# Patient Record
Sex: Female | Born: 1960 | Race: White | Hispanic: No | Smoking: Current every day smoker
Health system: Southern US, Community
[De-identification: ages and names within clinical notes are randomized; demographics above are authoritative.]

## PROBLEM LIST (undated history)

## (undated) DIAGNOSIS — G43909 Migraine, unspecified, not intractable, without status migrainosus: Secondary | ICD-10-CM

## (undated) DIAGNOSIS — J329 Chronic sinusitis, unspecified: Secondary | ICD-10-CM

## (undated) DIAGNOSIS — J189 Pneumonia, unspecified organism: Secondary | ICD-10-CM

---

## 2014-05-13 DIAGNOSIS — K635 Polyp of colon: Secondary | ICD-10-CM | POA: Insufficient documentation

## 2014-05-13 DIAGNOSIS — J309 Allergic rhinitis, unspecified: Secondary | ICD-10-CM | POA: Insufficient documentation

## 2014-05-13 DIAGNOSIS — J45909 Unspecified asthma, uncomplicated: Secondary | ICD-10-CM | POA: Insufficient documentation

## 2016-06-20 ENCOUNTER — Emergency Department (HOSPITAL_COMMUNITY)
Admission: EM | Admit: 2016-06-20 | Discharge: 2016-06-21 | Disposition: A | Discharge status: Home / Self Care | Payer: Self-pay | Attending: Emergency Medicine | Admitting: Emergency Medicine

## 2016-06-20 ENCOUNTER — Encounter (HOSPITAL_COMMUNITY): Payer: Self-pay | Admitting: Emergency Medicine

## 2016-06-20 DIAGNOSIS — G43809 Other migraine, not intractable, without status migrainosus: Secondary | ICD-10-CM | POA: Insufficient documentation

## 2016-06-20 DIAGNOSIS — Z79899 Other long term (current) drug therapy: Secondary | ICD-10-CM | POA: Insufficient documentation

## 2016-06-20 DIAGNOSIS — F17210 Nicotine dependence, cigarettes, uncomplicated: Secondary | ICD-10-CM | POA: Insufficient documentation

## 2016-06-20 DIAGNOSIS — Z7982 Long term (current) use of aspirin: Secondary | ICD-10-CM | POA: Insufficient documentation

## 2016-06-20 HISTORY — DX: Chronic sinusitis, unspecified: J32.9

## 2016-06-20 HISTORY — DX: Migraine, unspecified, not intractable, without status migrainosus: G43.909

## 2016-06-20 MED ORDER — DEXAMETHASONE SODIUM PHOSPHATE 4 MG/ML IJ SOLN
10.0000 mg | Freq: Once | INTRAMUSCULAR | Status: AC
  Administered 2016-06-20: 10 mg via INTRAVENOUS
  Filled 2016-06-20: qty 3

## 2016-06-20 MED ORDER — SODIUM CHLORIDE 0.9 % IV BOLUS (SEPSIS)
1000.0000 mL | Freq: Once | INTRAVENOUS | Status: AC
  Administered 2016-06-20: 1000 mL via INTRAVENOUS

## 2016-06-20 MED ORDER — PROCHLORPERAZINE EDISYLATE 5 MG/ML IJ SOLN
10.0000 mg | Freq: Once | INTRAMUSCULAR | Status: AC
  Administered 2016-06-20: 10 mg via INTRAVENOUS
  Filled 2016-06-20: qty 2

## 2016-06-20 MED ORDER — DIPHENHYDRAMINE HCL 50 MG/ML IJ SOLN
25.0000 mg | Freq: Once | INTRAMUSCULAR | Status: AC
  Administered 2016-06-20: 25 mg via INTRAVENOUS
  Filled 2016-06-20: qty 1

## 2016-06-20 NOTE — ED Triage Notes (Signed)
Pt reports a "sinus migraine" that began today around 1400. Pt denies emesis. Pt states she has a hx of migraines and allergies, has had problems with her sinuses.

## 2016-06-20 NOTE — ED Provider Notes (Signed)
[facility] DEPT Provider Note   CSN: [id number] Arrival date & time: 06/20/16  1942   By signing my name below, I, Dolores Hoose, attest that this documentation has been prepared under the direction and in the presence of Courtney Paris, MD . Electronically Signed: Dolores Hoose, Scribe. 06/20/2016. 9:49 PM.  History   Chief Complaint Chief Complaint  Patient presents with  . Migraine   The history is provided by the patient. No language interpreter was used.  Migraine  Chronicity: Chronic Exacerbation. The current episode started 6 to 12 hours ago. The problem occurs constantly. The problem has not changed since onset.Associated symptoms include headaches. Pertinent negatives include no chest pain, no abdominal pain and no shortness of breath. Exacerbated by: Sunday Corn. Nothing relieves the symptoms. Treatments tried: Excedrin and Tylenol. The treatment provided no relief.    HPI Comments:  Alexa Johnson is a 55 y.o. female who presents to the Emergency Department complaining of waxing/waning unchanged headache beginning today around 7 hours ago. She describes her pain as a 5/10 dull pain currently, but at its worst she reports it is around an 8/10 dull pain. Pt reports that her pain is located in the front of her head, behind her eyes and is exacerbated by light.  She has had headaches like this before in the past and they are frequently brought on by her allergies. Pt notes that she typically takes excedrin and tylenol for her pain, but has not had any relief with her OTC painkillers today. Pt reports associated nausea and photophobia. She denies any dysuria, constipation, diarrhea, abdominal pain, chest pain, SOB, coughing, fever, chills or visual disturbance. She also denies any recent trauma to her head.  She denies neck stiffness, severe neck pain, or any other complaints.  Past Medical History:  Diagnosis Date  . Migraines   . Sinusitis     There are no active problems  to display for this patient.   Past Surgical History:  Procedure Laterality Date  . CESAREAN SECTION      OB History    Gravida Para Term Preterm AB Living   3 3 3          SAB TAB Ectopic Multiple Live Births                   Home Medications    Prior to Admission medications   Not on File    Family History Family History  Problem Relation Age of Onset  . Diabetes Mother   . Hypertension Mother   . Diabetes Father   . Cancer Father   . Heart failure Other   . Cancer Other     Social History Social History  Substance Use Topics  . Smoking status: Current Every Day Smoker    Packs/day: 0.50    Types: Cigarettes  . Smokeless tobacco: Never Used  . Alcohol use Yes     Comment: rarely     Allergies   Hydrocodone and Sulfa antibiotics   Review of Systems Review of Systems  Constitutional: Negative for chills and fever.  HENT: Negative for congestion and rhinorrhea.   Eyes: Positive for photophobia. Negative for visual disturbance.  Respiratory: Negative for cough, chest tightness, shortness of breath, wheezing and stridor.   Cardiovascular: Negative for chest pain.  Gastrointestinal: Negative for abdominal pain, constipation, diarrhea, nausea and vomiting.  Genitourinary: Negative for dysuria.  Musculoskeletal: Negative for back pain, neck pain and neck stiffness.  Skin: Negative for wound.  Neurological: Positive  for headaches. Negative for dizziness, weakness, light-headedness and numbness.  Psychiatric/Behavioral: Negative for agitation and confusion.  All other systems reviewed and are negative.    Physical Exam Updated Vital Signs BP 124/85 (BP Location: Left Arm)   Pulse 76   Temp 97.6 F (36.4 C) (Oral)   Resp 16   Ht 5\' 6"  (1.676 m)   Wt 170 lb (77.1 kg)   SpO2 97%   BMI 27.44 kg/m   Physical Exam  Constitutional: She is oriented to person, place, and time. She appears well-developed and well-nourished. No distress.  HENT:  Head:  Normocephalic and atraumatic.  Mouth/Throat: Oropharynx is clear and moist. No oropharyngeal exudate.  Eyes: Conjunctivae and EOM are normal. Pupils are equal, round, and reactive to light.  Photosensitive  Neck: Normal range of motion. Neck supple.  No nuchal rigidity or tenderness.   Cardiovascular: Normal rate, regular rhythm, normal heart sounds and intact distal pulses.   No murmur heard. Pulmonary/Chest: Effort normal and breath sounds normal. No stridor. No respiratory distress. She has no wheezes. She exhibits no tenderness.  Abdominal: Soft. There is no tenderness.  Musculoskeletal: She exhibits no edema.  Neurological: She is alert and oriented to person, place, and time. She has normal strength and normal reflexes. She displays no tremor and normal reflexes. No cranial nerve deficit or sensory deficit. She exhibits normal muscle tone. Coordination normal.  Skin: Skin is warm and dry. Capillary refill takes less than 2 seconds. No rash noted.  Psychiatric: She has a normal mood and affect.  Nursing note and vitals reviewed.   ED Treatments / Results  DIAGNOSTIC STUDIES:  Oxygen Saturation is 97% on RA, normal by my interpretation.    COORDINATION OF CARE:  10:09 PM Discussed treatment plan with pt at bedside which included pain medication and pt agreed to plan.  Labs (all labs ordered are listed, but only abnormal results are displayed) Labs Reviewed - No data to display  EKG  EKG Interpretation None       Radiology No results found.  Procedures Procedures (including critical care time)  Medications Ordered in ED Medications  sodium chloride 0.9 % bolus 1,000 mL (0 mLs Intravenous Stopped 06/20/16 2356)  prochlorperazine (COMPAZINE) injection 10 mg (10 mg Intravenous Given 06/20/16 2247)  diphenhydrAMINE (BENADRYL) injection 25 mg (25 mg Intravenous Given 06/20/16 2246)  dexamethasone (DECADRON) injection 10 mg (10 mg Intravenous Given 06/20/16 2249)      Initial Impression / Assessment and Plan / ED Course  I have reviewed the triage vital signs and the nursing notes.  Pertinent labs & imaging results that were available during my care of the patient were reviewed by me and considered in my medical decision making (see chart for details).  Clinical Course    Alexa Johnson is a 54 y.o. female who presents to the Emergency Department complaining of waxing/waning unchanged headache beginning today around 7 hours ago.  History and exam are seen above.  Patients headache feels like her typical severe migraine. Based on exam, don't meningitis. No infectious complaints. Normal neurologic exam. No recent trauma. No red flags on exam.  Patient given headache cocktail with marked improvement in headache severity. Patient feels much better and continue to have unremarkable exam. Given near resolution of headache, patient felt appropriate for discharge and further management at home. Patient given return precautions for any new or worsening symptoms. Patient had no other questions or concerns and patient discharged in good condition.  Final Clinical Impressions(s) /  ED Diagnoses   Final diagnoses:  Other migraine without status migrainosus, not intractable    New Prescriptions Discharge Medication List as of 06/20/2016 11:58 PM      I personally performed the services described in this documentation, which was scribed in my presence. The recorded information has been reviewed and is accurate.   Clinical Impression: 1. Other migraine without status migrainosus, not intractable     Disposition: Discharge  Condition: Good  I have discussed the results, Dx and Tx plan with the pt(& family if present). He/she/they expressed understanding and agree(s) with the plan. Discharge instructions discussed at great length. Strict return precautions discussed and pt &/or family have verbalized understanding of the instructions. No further  questions at time of discharge.    Discharge Medication List as of 06/20/2016 11:58 PM      Follow Up: [facility] [address] [place] [phone] Schedule an appointment as soon as possible for a visit    [facility] [address] [id number] [place]c [place] [phone]  If symptoms worsen      Courtney Paris, MD 06/21/16 1146

## 2016-10-11 ENCOUNTER — Encounter (HOSPITAL_COMMUNITY): Payer: Self-pay | Admitting: Emergency Medicine

## 2016-10-11 ENCOUNTER — Emergency Department (HOSPITAL_COMMUNITY)
Admission: EM | Admit: 2016-10-11 | Discharge: 2016-10-11 | Disposition: A | Discharge status: Home Health | Payer: Self-pay | Attending: Emergency Medicine | Admitting: Emergency Medicine

## 2016-10-11 DIAGNOSIS — J069 Acute upper respiratory infection, unspecified: Secondary | ICD-10-CM | POA: Insufficient documentation

## 2016-10-11 DIAGNOSIS — F17210 Nicotine dependence, cigarettes, uncomplicated: Secondary | ICD-10-CM | POA: Insufficient documentation

## 2016-10-11 MED ORDER — FLUTICASONE PROPIONATE 50 MCG/ACT NA SUSP: 2.0000 | Freq: Every day | NASAL | 1 refills | Status: AC

## 2016-10-11 MED ORDER — PSEUDOEPHEDRINE HCL 60 MG PO TABS: 60.0000 mg | ORAL_TABLET | Freq: Four times a day (QID) | ORAL | 0 refills | Status: DC | PRN

## 2016-10-11 NOTE — ED Triage Notes (Signed)
Cold symptoms since Sunday.  Cough non-productive, sore throat, nasal congestion, and headache.

## 2016-10-11 NOTE — Discharge Instructions (Signed)
Tylenol or ibuprofen if needed for headache.  Follow-up with your doctor or return here for any worsening symtpoms

## 2016-10-13 NOTE — ED Provider Notes (Signed)
[facility] DEPT Provider Note   CSN: [id number] Arrival date & time: 10/11/16  1250     History   Chief Complaint Chief Complaint  Patient presents with  . Nasal Congestion    HPI Alexa Johnson is a 56 y.o. female.  HPI   Alexa Johnson is a 56 y.o. female who presents to the Emergency Department complaining of sore throat, nasal congestion, frontal headache and occasional cough.  Symptoms present for three days.  Cough is non-productive.  She states her symptoms feel similar to previous sinus infections.  She has tried OTC sinus medications w/o relief.  She denies shortness of breath, chest pain, neck pain or stiffness, fever, chills, and body aches.   Past Medical History:  Diagnosis Date  . Migraines   . Sinusitis     There are no active problems to display for this patient.   Past Surgical History:  Procedure Laterality Date  . CESAREAN SECTION      OB History    Gravida Para Term Preterm AB Living   3 3 3          SAB TAB Ectopic Multiple Live Births                   Home Medications    Prior to Admission medications   Medication Sig Start Date End Date Taking? Authorizing Provider  aspirin-acetaminophen-caffeine (EXCEDRIN EXTRA STRENGTH) [phone] MG tablet Take 1-2 tablets by mouth every 6 (six) hours as needed for headache.    Historical Provider, MD  fluticasone (FLONASE) 50 MCG/ACT nasal spray Place 2 sprays into both nostrils daily. 10/11/16   Lorimer Tiberio, PA-C  Misc Natural Products (SINUS FORMULA PO) Take 2 tablets by mouth daily as needed (for sinus relief).    Historical Provider, MD  pseudoephedrine (SUDAFED) 60 MG tablet Take 1 tablet (60 mg total) by mouth every 6 (six) hours as needed for congestion. 10/11/16   Estelene Carmack, PA-C    Family History Family History  Problem Relation Age of Onset  . Diabetes Mother   . Hypertension Mother   . Diabetes Father   . Cancer Father   . Heart failure Other   . Cancer Other      Social History Social History  Substance Use Topics  . Smoking status: Current Every Day Smoker    Packs/day: 0.50    Types: Cigarettes  . Smokeless tobacco: Never Used  . Alcohol use Yes     Comment: rarely     Allergies   Hydrocodone and Sulfa antibiotics   Review of Systems Review of Systems  Constitutional: Negative for activity change, appetite change, chills and fever.  HENT: Positive for congestion, sinus pain, sinus pressure and sore throat. Negative for facial swelling, rhinorrhea and trouble swallowing.   Eyes: Negative for visual disturbance.  Respiratory: Positive for cough. Negative for shortness of breath, wheezing and stridor.   Cardiovascular: Negative for chest pain.  Gastrointestinal: Negative for nausea and vomiting.  Musculoskeletal: Negative for neck pain and neck stiffness.  Skin: Negative.  Negative for rash.  Neurological: Positive for headaches. Negative for dizziness, syncope, speech difficulty, weakness and numbness.  Hematological: Negative for adenopathy.  All other systems reviewed and are negative.    Physical Exam Updated Vital Signs BP 130/85 (BP Location: Left Arm)   Pulse 78   Temp 98.2 F (36.8 C) (Oral)   Resp 16   Ht 5\' 6"  (1.676 m)   Wt 77.1 kg   SpO2 98%  BMI 27.44 kg/m   Physical Exam  Constitutional: She is oriented to person, place, and time. She appears well-developed and well-nourished. No distress.  HENT:  Head: Normocephalic and atraumatic.  Right Ear: Tympanic membrane and ear canal normal.  Left Ear: Tympanic membrane and ear canal normal.  Nose: Mucosal edema present. No rhinorrhea. Right sinus exhibits frontal sinus tenderness. Left sinus exhibits frontal sinus tenderness.  Mouth/Throat: Uvula is midline and mucous membranes are normal. No trismus in the jaw. No uvula swelling. Posterior oropharyngeal erythema present. No oropharyngeal exudate, posterior oropharyngeal edema or tonsillar abscesses. Tonsils  are 0 on the right. Tonsils are 0 on the left.  Eyes: Conjunctivae are normal.  Neck: Normal range of motion and phonation normal. Neck supple. No Brudzinski's sign and no Kernig's sign noted.  Cardiovascular: Normal rate, regular rhythm, normal heart sounds and intact distal pulses.   No murmur heard. Pulmonary/Chest: Effort normal and breath sounds normal. No respiratory distress. She has no wheezes. She has no rales.  Musculoskeletal: She exhibits no edema.  Lymphadenopathy:    She has no cervical adenopathy.  Neurological: She is alert and oriented to person, place, and time. She exhibits normal muscle tone. Coordination normal.  Skin: Skin is warm and dry.  Nursing note and vitals reviewed.    ED Treatments / Results  Labs (all labs ordered are listed, but only abnormal results are displayed) Labs Reviewed - No data to display  EKG  EKG Interpretation None       Radiology No results found.  Procedures Procedures (including critical care time)  Medications Ordered in ED Medications - No data to display   Initial Impression / Assessment and Plan / ED Course  I have reviewed the triage vital signs and the nursing notes.  Pertinent labs & imaging results that were available during my care of the patient were reviewed by me and considered in my medical decision making (see chart for details).     Pt well appearing,vitals stable.  Sx's likely related to viral URI.  Pt agrees to symptomatic tx and PCP f/u if needed.  Pt appears stable for d/c and agrees to plan  Final Clinical Impressions(s) / ED Diagnoses   Final diagnoses:  Acute upper respiratory infection    New Prescriptions Discharge Medication List as of 10/11/2016  1:42 PM    START taking these medications   Details  fluticasone (FLONASE) 50 MCG/ACT nasal spray Place 2 sprays into both nostrils daily., Starting Wed 10/11/2016, Print    pseudoephedrine (SUDAFED) 60 MG tablet Take 1 tablet (60 mg total) by  mouth every 6 (six) hours as needed for congestion., Starting Wed 10/11/2016, Print         Richy Spradley [place], PA-C 10/13/16 2156    Merrily Pew, MD 10/15/16 1209

## 2016-10-16 ENCOUNTER — Emergency Department (HOSPITAL_COMMUNITY)
Admission: EM | Admit: 2016-10-16 | Discharge: 2016-10-16 | Disposition: A | Discharge status: Home / Self Care | Payer: Self-pay | Attending: Emergency Medicine | Admitting: Emergency Medicine

## 2016-10-16 ENCOUNTER — Encounter (HOSPITAL_COMMUNITY): Payer: Self-pay | Admitting: Emergency Medicine

## 2016-10-16 DIAGNOSIS — Z7982 Long term (current) use of aspirin: Secondary | ICD-10-CM | POA: Insufficient documentation

## 2016-10-16 DIAGNOSIS — F17210 Nicotine dependence, cigarettes, uncomplicated: Secondary | ICD-10-CM | POA: Insufficient documentation

## 2016-10-16 DIAGNOSIS — J029 Acute pharyngitis, unspecified: Secondary | ICD-10-CM | POA: Insufficient documentation

## 2016-10-16 DIAGNOSIS — Z79899 Other long term (current) drug therapy: Secondary | ICD-10-CM | POA: Insufficient documentation

## 2016-10-16 LAB — RAPID STREP SCREEN (MED CTR MEBANE ONLY): STREPTOCOCCUS, GROUP A SCREEN (DIRECT): NEGATIVE

## 2016-10-16 MED ORDER — KETOROLAC TROMETHAMINE 30 MG/ML IJ SOLN
30.0000 mg | Freq: Once | INTRAMUSCULAR | Status: AC
  Administered 2016-10-16: 30 mg via INTRAMUSCULAR
  Filled 2016-10-16: qty 1

## 2016-10-16 MED ORDER — IBUPROFEN 600 MG PO TABS: 600.0000 mg | ORAL_TABLET | Freq: Four times a day (QID) | ORAL | 0 refills | Status: DC | PRN

## 2016-10-16 MED ORDER — DEXAMETHASONE SODIUM PHOSPHATE 10 MG/ML IJ SOLN
10.0000 mg | Freq: Once | INTRAMUSCULAR | Status: AC
  Administered 2016-10-16: 10 mg via INTRAMUSCULAR
  Filled 2016-10-16: qty 1

## 2016-10-16 NOTE — ED Triage Notes (Signed)
Pt c/o sore throat since last Sunday with worsening bilateral ear pain. Pt seen in ED on Wednesday for cough/congestion and ha.

## 2016-10-16 NOTE — ED Provider Notes (Signed)
[facility] DEPT Provider Note   CSN: [id number] Arrival date & time: 10/16/16  [id number]  By signing my name below, I, Ethelle Lyon Long, attest that this documentation has been prepared under the direction and in the presence of Isla Pence, MD . Electronically Signed: Ethelle Lyon Long, Scribe. 10/16/2016. 8:39 AM.   History   Chief Complaint Chief Complaint  Patient presents with  . Sore Throat    The history is provided by the patient and medical records. No language interpreter was used.   HPI Comments:  Alexa Johnson is a 56 y.o. female who presents to the Emergency Department complaining of a gradually worsening sore throat onset ~2-3 days ago. She notes the throat pain worsening two to three days ago with slight alleviation of her symptoms by consumption of hot liquids. She has decreased PO intake d/t pain. She has associated symptoms of a subjective fever, bilateral ear pain, cough, congestion, and HA. Per chart review, pt was seen in the ED 10/11/2016 for similar symptoms of cough, congestion, and Ha. Pt is taking Excedrin and Flonase with mild relief of her symptoms. Pt is a current every day smoker.   Past Medical History:  Diagnosis Date  . Migraines   . Sinusitis     There are no active problems to display for this patient.   Past Surgical History:  Procedure Laterality Date  . CESAREAN SECTION      OB History    Gravida Para Term Preterm AB Living   3 3 3          SAB TAB Ectopic Multiple Live Births                   Home Medications    Prior to Admission medications   Medication Sig Start Date End Date Taking? Authorizing Provider  aspirin-acetaminophen-caffeine (EXCEDRIN EXTRA STRENGTH) [phone] MG tablet Take 1-2 tablets by mouth every 6 (six) hours as needed for headache.    Historical Provider, MD  fluticasone (FLONASE) 50 MCG/ACT nasal spray Place 2 sprays into both nostrils daily. 10/11/16   Tammy Triplett, PA-C  ibuprofen (ADVIL,MOTRIN) 600 MG  tablet Take 1 tablet (600 mg total) by mouth every 6 (six) hours as needed. 10/16/16   Isla Pence, MD  Misc Natural Products (SINUS FORMULA PO) Take 2 tablets by mouth daily as needed (for sinus relief).    Historical Provider, MD  pseudoephedrine (SUDAFED) 60 MG tablet Take 1 tablet (60 mg total) by mouth every 6 (six) hours as needed for congestion. 10/11/16   Tammy Triplett, PA-C    Family History Family History  Problem Relation Age of Onset  . Diabetes Mother   . Hypertension Mother   . Diabetes Father   . Cancer Father   . Heart failure Other   . Cancer Other     Social History Social History  Substance Use Topics  . Smoking status: Current Every Day Smoker    Packs/day: 0.50    Types: Cigarettes  . Smokeless tobacco: Never Used  . Alcohol use Yes     Comment: rarely     Allergies   Hydrocodone and Sulfa antibiotics   Review of Systems Review of Systems 10 systems reviewed and all are negative for acute change except as noted in the HPI.    Physical Exam Updated Vital Signs BP 124/80   Pulse 62   Temp 98.1 F (36.7 C) (Oral)   Resp 16   Ht 5\' 6"  (1.676 m)   Wt  170 lb (77.1 kg)   SpO2 98%   BMI 27.44 kg/m   Physical Exam  Constitutional: She is oriented to person, place, and time. She appears well-developed and well-nourished.  HENT:  Head: Normocephalic and atraumatic.  Right Ear: Tympanic membrane, external ear and ear canal normal.  Left Ear: Tympanic membrane, external ear and ear canal normal.  Nose: Nose normal.  Pharyngeal erythema.   Eyes: Right eye exhibits no discharge. Left eye exhibits no discharge.  Cardiovascular: Normal rate, regular rhythm and normal heart sounds.   Pulmonary/Chest: Effort normal and breath sounds normal.  Abdominal: Soft. There is no tenderness.  Lymphadenopathy:    She has cervical adenopathy.  Neurological: She is alert and oriented to person, place, and time.  Skin: Skin is warm and dry.  Nursing note and  vitals reviewed.    ED Treatments / Results  DIAGNOSTIC STUDIES:  Oxygen Saturation is 96% on RA, adequate by my interpretation.    COORDINATION OF CARE:  8:35 AM Discussed treatment plan with pt at bedside including symptom relief and pt agreed to plan.  .Labs (all labs ordered are listed, but only abnormal results are displayed) Labs Reviewed  RAPID STREP SCREEN (NOT AT [facility])  CULTURE, GROUP A STREP [facility])    EKG  EKG Interpretation None       Radiology No results found.  Procedures Procedures (including critical care time)  Medications Ordered in ED Medications  ketorolac (TORADOL) 30 MG/ML injection 30 mg (30 mg Intramuscular Given 10/16/16 0843)  dexamethasone (DECADRON) injection 10 mg (10 mg Intramuscular Given 10/16/16 0842)     Initial Impression / Assessment and Plan / ED Course  I have reviewed the triage vital signs and the nursing notes.  Pertinent labs & imaging results that were available during my care of the patient were reviewed by me and considered in my medical decision making (see chart for details).    Strep is negative.  Pt to be treated with ibuprofen.  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  She knows to return if worse.   Final Clinical Impressions(s) / ED Diagnoses   Final diagnoses:  Viral pharyngitis    New Prescriptions Discharge Medication List as of 10/16/2016  9:25 AM    START taking these medications   Details  ibuprofen (ADVIL,MOTRIN) 600 MG tablet Take 1 tablet (600 mg total) by mouth every 6 (six) hours as needed., Starting Mon 10/16/2016, Print       I personally performed the services described in this documentation, which was scribed in my presence. The recorded information has been reviewed and is accurate.     Isla Pence, MD 10/16/16 [phone]

## 2016-10-18 LAB — CULTURE, GROUP A STREP (THRC)

## 2016-12-11 ENCOUNTER — Emergency Department (HOSPITAL_COMMUNITY)
Admission: EM | Admit: 2016-12-11 | Discharge: 2016-12-11 | Disposition: A | Discharge status: Home / Self Care | Payer: Self-pay | Attending: Emergency Medicine | Admitting: Emergency Medicine

## 2016-12-11 ENCOUNTER — Encounter (HOSPITAL_COMMUNITY): Payer: Self-pay | Admitting: Emergency Medicine

## 2016-12-11 ENCOUNTER — Emergency Department (HOSPITAL_COMMUNITY): Payer: Self-pay

## 2016-12-11 DIAGNOSIS — X501XXA Overexertion from prolonged static or awkward postures, initial encounter: Secondary | ICD-10-CM | POA: Insufficient documentation

## 2016-12-11 DIAGNOSIS — M25552 Pain in left hip: Secondary | ICD-10-CM

## 2016-12-11 DIAGNOSIS — Y92000 Kitchen of unspecified non-institutional (private) residence as  the place of occurrence of the external cause: Secondary | ICD-10-CM | POA: Insufficient documentation

## 2016-12-11 DIAGNOSIS — Y9301 Activity, walking, marching and hiking: Secondary | ICD-10-CM | POA: Insufficient documentation

## 2016-12-11 DIAGNOSIS — Z79899 Other long term (current) drug therapy: Secondary | ICD-10-CM | POA: Insufficient documentation

## 2016-12-11 DIAGNOSIS — S76012A Strain of muscle, fascia and tendon of left hip, initial encounter: Secondary | ICD-10-CM

## 2016-12-11 DIAGNOSIS — F17210 Nicotine dependence, cigarettes, uncomplicated: Secondary | ICD-10-CM | POA: Insufficient documentation

## 2016-12-11 DIAGNOSIS — Y999 Unspecified external cause status: Secondary | ICD-10-CM | POA: Insufficient documentation

## 2016-12-11 HISTORY — DX: Pneumonia, unspecified organism: J18.9

## 2016-12-11 MED ORDER — DEXAMETHASONE 4 MG PO TABS: 4.0000 mg | ORAL_TABLET | Freq: Two times a day (BID) | ORAL | 0 refills | Status: DC

## 2016-12-11 MED ORDER — PREDNISONE 20 MG PO TABS
40.0000 mg | ORAL_TABLET | Freq: Once | ORAL | Status: AC
  Administered 2016-12-11: 40 mg via ORAL
  Filled 2016-12-11: qty 2

## 2016-12-11 MED ORDER — DICLOFENAC SODIUM 75 MG PO TBEC: 75.0000 mg | DELAYED_RELEASE_TABLET | Freq: Two times a day (BID) | ORAL | 0 refills | Status: DC

## 2016-12-11 MED ORDER — ONDANSETRON HCL 4 MG PO TABS
4.0000 mg | ORAL_TABLET | Freq: Once | ORAL | Status: AC
  Administered 2016-12-11: 4 mg via ORAL
  Filled 2016-12-11: qty 1

## 2016-12-11 MED ORDER — CYCLOBENZAPRINE HCL 10 MG PO TABS
10.0000 mg | ORAL_TABLET | Freq: Once | ORAL | Status: AC
  Administered 2016-12-11: 10 mg via ORAL
  Filled 2016-12-11: qty 1

## 2016-12-11 MED ORDER — CYCLOBENZAPRINE HCL 10 MG PO TABS: 10.0000 mg | ORAL_TABLET | Freq: Three times a day (TID) | ORAL | 0 refills | Status: DC

## 2016-12-11 MED ORDER — KETOROLAC TROMETHAMINE 10 MG PO TABS
10.0000 mg | ORAL_TABLET | Freq: Once | ORAL | Status: AC
  Administered 2016-12-11: 10 mg via ORAL
  Filled 2016-12-11: qty 1

## 2016-12-11 NOTE — ED Provider Notes (Signed)
[facility] DEPT Provider Note   CSN: [id number] Arrival date & time: 12/11/16  7591  By signing my name below, I, Jeanell Sparrow, attest that this documentation has been prepared under the direction and in the presence of non-physician practitioner, Lily Kocher, PA-C. Electronically Signed: Jeanell Sparrow, Scribe. 12/11/2016. 11:54 AM.  History   Chief Complaint Chief Complaint  Patient presents with  . Hip Pain   The history is provided by the patient. No language interpreter was used.  Hip Pain  This is a new problem. The current episode started more than 2 days ago. The problem occurs constantly. The problem has not changed since onset.Nothing aggravates the symptoms. Nothing relieves the symptoms. She has tried nothing for the symptoms.   HPI Comments: Alexa Johnson is a 56 y.o. female with a PMHx of Sciatica who presents to the Emergency Department complaining of constant moderate left hip pain that started about 4 days ago. She states she started ambulating in her kitchen when she suddenly "heard a pop". She describes the pain both as different from her Sciatica pain and unaffected by movement or position. She denies any prior hx of similar complaint, hx of hip surgery, or other complaints.     [facility]   Past Medical History:  Diagnosis Date  . Migraines   . Pneumonia   . Sinusitis     There are no active problems to display for this patient.   Past Surgical History:  Procedure Laterality Date  . CESAREAN SECTION      OB History    Gravida Para Term Preterm AB Living   3 3 3          SAB TAB Ectopic Multiple Live Births                   Home Medications    Prior to Admission medications   Medication Sig Start Date End Date Taking? Authorizing Provider  aspirin-acetaminophen-caffeine (EXCEDRIN EXTRA STRENGTH) [phone] MG tablet Take 1-2 tablets by mouth every 6 (six) hours as needed for headache.   Yes Historical Provider, MD  fluticasone  (FLONASE) 50 MCG/ACT nasal spray Place 2 sprays into both nostrils daily. 10/11/16  Yes Tammy Triplett, PA-C  Misc Natural Products (SINUS FORMULA PO) Take 2 tablets by mouth daily as needed (for sinus relief).   Yes Historical Provider, MD  pseudoephedrine (SUDAFED) 60 MG tablet Take 1 tablet (60 mg total) by mouth every 6 (six) hours as needed for congestion. 10/11/16  Yes Tammy Triplett, PA-C  cyclobenzaprine (FLEXERIL) 10 MG tablet Take 1 tablet (10 mg total) by mouth 3 (three) times daily. 12/11/16   Lily Kocher, PA-C  dexamethasone (DECADRON) 4 MG tablet Take 1 tablet (4 mg total) by mouth 2 (two) times daily with a meal. 12/11/16   Lily Kocher, PA-C  diclofenac (VOLTAREN) 75 MG EC tablet Take 1 tablet (75 mg total) by mouth 2 (two) times daily. 12/11/16   Lily Kocher, PA-C  ibuprofen (ADVIL,MOTRIN) 600 MG tablet Take 1 tablet (600 mg total) by mouth every 6 (six) hours as needed. Patient not taking: Reported on 12/11/2016 10/16/16   Isla Pence, MD    Family History Family History  Problem Relation Age of Onset  . Diabetes Mother   . Hypertension Mother   . Diabetes Father   . Cancer Father   . Heart failure Other   . Cancer Other     Social History Social History  Substance Use Topics  . Smoking status: Current  Every Day Smoker    Packs/day: 0.50    Types: Cigarettes  . Smokeless tobacco: Never Used  . Alcohol use Yes     Comment: rarely     Allergies   Hydrocodone and Sulfa antibiotics   Review of Systems Review of Systems  Musculoskeletal: Positive for myalgias (Left hip).  All other systems reviewed and are negative.    Physical Exam Updated Vital Signs BP 125/75 (BP Location: Right Arm)   Pulse 72   Temp 98.9 F (37.2 C) (Oral)   Resp 18   Ht 5\' 6"  (1.676 m)   Wt 170 lb (77.1 kg)   SpO2 97%   BMI 27.44 kg/m   Physical Exam  Constitutional: She appears well-developed and well-nourished. No distress.  HENT:  Head: Normocephalic.  Eyes: Conjunctivae  are normal.  Neck: Neck supple.  Cardiovascular: Normal rate and regular rhythm.   Pulmonary/Chest: Effort normal.  Abdominal: Soft. She exhibits no mass.  Soft. No pulsating mass.   Musculoskeletal: Normal range of motion.  DP pulses are +2 bilaterally. No temperature changes of the BLE. Degenerative changes of the left knee. No effusion or mass. No deformity of the quadricept area. Tenderness to movement and palpation of the left hip. No shortening of the LE.   Neurological: She is alert.  Gait is intact. No foot drop. Does walk with a limp.   Skin: Skin is warm and dry.  Psychiatric: She has a normal mood and affect.  Nursing note and vitals reviewed.    ED Treatments / Results  DIAGNOSTIC STUDIES: Oxygen Saturation is 97% on RA, normal by my interpretation.    COORDINATION OF CARE: 11:58 AM- Pt advised of plan for treatment and pt agrees.  Labs (all labs ordered are listed, but only abnormal results are displayed) Labs Reviewed - No data to display  EKG  EKG Interpretation None       Radiology Dg Hip Unilat W Or Wo Pelvis 2-3 Views Left  Result Date: 12/11/2016 CLINICAL DATA:  Pain for 4 days EXAM: DG HIP (WITH OR WITHOUT PELVIS) 2-3V LEFT COMPARISON:  None. FINDINGS: Frontal pelvis as well as frontal and lateral left hip images were obtained. There is no fracture or dislocation. Joint spaces appear normal. No erosive change. IMPRESSION: No fracture or dislocation.  No appreciable arthropathy. Electronically Signed   By: Lowella Grip III M.D.   On: 12/11/2016 09:59    Procedures Procedures (including critical care time)  Medications Ordered in ED Medications  cyclobenzaprine (FLEXERIL) tablet 10 mg (10 mg Oral Given 12/11/16 1221)  predniSONE (DELTASONE) tablet 40 mg (40 mg Oral Given 12/11/16 1221)  ketorolac (TORADOL) tablet 10 mg (10 mg Oral Given 12/11/16 1221)  ondansetron (ZOFRAN) tablet 4 mg (4 mg Oral Given 12/11/16 1221)     Initial Impression / Assessment  and Plan / ED Course  I have reviewed the triage vital signs and the nursing notes.  Pertinent labs & imaging results that were available during my care of the patient were reviewed by me and considered in my medical decision making (see chart for details).     **I have reviewed nursing notes, vital signs, and all appropriate lab and imaging results for this patient.*  Final Clinical Impressions(s) / ED Diagnoses   MDM: Left hip area pain is not improved with rest and warm tub soaks. No reported loss of bowel or bladder function. Xray of the hip and pelvis are negative for acute changes. I suspect musculoskeletal strain and  osteoarthritis. Pt will be treated with warm tub soak, muscles relaxant, steroid, and anti-inflammatory pain medication. Will see orthopedics if not improving.   Final diagnoses:  Left hip pain  Hip strain, left, initial encounter    New Prescriptions Discharge Medication List as of 12/11/2016 12:16 PM    START taking these medications   Details  cyclobenzaprine (FLEXERIL) 10 MG tablet Take 1 tablet (10 mg total) by mouth 3 (three) times daily., Starting Mon 12/11/2016, Print    dexamethasone (DECADRON) 4 MG tablet Take 1 tablet (4 mg total) by mouth 2 (two) times daily with a meal., Starting Mon 12/11/2016, Print    diclofenac (VOLTAREN) 75 MG EC tablet Take 1 tablet (75 mg total) by mouth 2 (two) times daily., Starting Mon 12/11/2016, Print       **I personally performed the services described in this documentation, which was scribed in my presence. The recorded information has been reviewed and is accurate.Lily Kocher, PA-C 12/12/16 1901    Nat Christen, MD 12/14/16 1003

## 2016-12-11 NOTE — Discharge Instructions (Signed)
The x-ray of your left hip and the x-ray of your pelvis is negative for fracture or dislocation. I suspect that you have a muscle strain, aggravated by some arthritis or degenerative changes. Please use warm tub soaks 2 times daily. Use Flexeril 3 times daily for spasm pain. Use Decadron and diclofenac 2 times daily with food. Flexeril may cause drowsiness. Please do not drive a vehicle, operating machinery, drink alcohol, or participate in activities requiring concentration when taking this medication. Please see Dr. Aline Brochure for orthopedic evaluation if not improving.

## 2016-12-11 NOTE — ED Triage Notes (Signed)
Pt c/o sudden left hip pain x 4 days ago. Denies injury. States must have turned the wrong way. Mild limping noted.

## 2016-12-11 NOTE — ED Notes (Signed)
Pt made aware to return if symptoms worsen or if any life threatening symptoms occur.   

## 2017-10-13 ENCOUNTER — Encounter (HOSPITAL_COMMUNITY): Payer: Self-pay

## 2017-10-13 ENCOUNTER — Emergency Department (HOSPITAL_COMMUNITY)
Admission: EM | Admit: 2017-10-13 | Discharge: 2017-10-13 | Disposition: A | Discharge status: Home / Self Care | Payer: Self-pay | Attending: Emergency Medicine | Admitting: Emergency Medicine

## 2017-10-13 DIAGNOSIS — Z79899 Other long term (current) drug therapy: Secondary | ICD-10-CM | POA: Insufficient documentation

## 2017-10-13 DIAGNOSIS — F17210 Nicotine dependence, cigarettes, uncomplicated: Secondary | ICD-10-CM | POA: Insufficient documentation

## 2017-10-13 DIAGNOSIS — J040 Acute laryngitis: Secondary | ICD-10-CM | POA: Insufficient documentation

## 2017-10-13 MED ORDER — ACETAMINOPHEN 325 MG PO TABS: 650.0000 mg | ORAL_TABLET | Freq: Four times a day (QID) | ORAL | 0 refills | Status: DC | PRN

## 2017-10-13 MED ORDER — ACETAMINOPHEN 325 MG PO TABS
650.0000 mg | ORAL_TABLET | Freq: Once | ORAL | Status: AC
  Administered 2017-10-13: 650 mg via ORAL
  Filled 2017-10-13: qty 2

## 2017-10-13 NOTE — ED Provider Notes (Signed)
[facility] EMERGENCY DEPARTMENT Provider Note   CSN: [id number] Arrival date & time: 10/13/17  0830     History   Chief Complaint Chief Complaint  Patient presents with  . Sore Throat    HPI Alexa Johnson is a 57 y.o. female.  HPI   Patient is a 57 year old female who is presenting to the ED complaining of a sore throat and that she lost her voice starting yesterday.  Patient states that she woke up around 6:30 PM yesterday when she experienced these sxs.  Also notes some swollen lymph nodes to the anterior part of her neck as well as some bilateral ear pain and a frontal HA. Has frequent HA's from sinus congestion, and this feels similar to that. No sudden onset, not severe pain. She has chronic nasal congestion and postnasal drip from allergies but denies any sinus pain pressure, fevers, cough, shortness of breath, chest pain, nausea, vomiting, abdominal pain, diarrhea.  Has been able tolerate PO and is able to tolerate her own secretions. Denies any lip/tongue swelling, throat swelling, difficulty swallowing, or neck stiffness. She denies any recent URI symptoms prior to the onset of the symptoms.  Has chronic posterior/right sided neck pain and trapezious pain. 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 No one in the household has similar symptoms. Pt smokes tobacco. Has a frequent h/o laryngitis.  Past Medical History:  Diagnosis Date  . Migraines   . Pneumonia   . Sinusitis     There are no active problems to display for this patient.   Past Surgical History:  Procedure Laterality Date  . CESAREAN SECTION      OB History    Gravida Para Term Preterm AB Living   3 3 3          SAB TAB Ectopic Multiple Live Births                   Home Medications    Prior to Admission medications   Medication Sig Start Date End Date Taking? Authorizing Provider  acetaminophen (TYLENOL) 325 MG tablet Take 2 tablets (650 mg total) by mouth every 6 (six)  hours as needed. 10/13/17   Myron Stankovich S, PA-C  aspirin-acetaminophen-caffeine (EXCEDRIN EXTRA STRENGTH) [phone] MG tablet Take 1-2 tablets by mouth every 6 (six) hours as needed for headache.    [provider]  cyclobenzaprine (FLEXERIL) 10 MG tablet Take 1 tablet (10 mg total) by mouth 3 (three) times daily. 12/11/16   Lily Kocher, PA-C  dexamethasone (DECADRON) 4 MG tablet Take 1 tablet (4 mg total) by mouth 2 (two) times daily with a meal. 12/11/16   Lily Kocher, PA-C  diclofenac (VOLTAREN) 75 MG EC tablet Take 1 tablet (75 mg total) by mouth 2 (two) times daily. 12/11/16   Lily Kocher, PA-C  fluticasone (FLONASE) 50 MCG/ACT nasal spray Place 2 sprays into both nostrils daily. 10/11/16   Triplett, Tammy, PA-C  ibuprofen (ADVIL,MOTRIN) 600 MG tablet Take 1 tablet (600 mg total) by mouth every 6 (six) hours as needed. Patient not taking: Reported on 12/11/2016 10/16/16   Isla Pence, MD  Misc Natural Products (SINUS FORMULA PO) Take 2 tablets by mouth daily as needed (for sinus relief).    [provider]  pseudoephedrine (SUDAFED) 60 MG tablet Take 1 tablet (60 mg total) by mouth every 6 (six) hours as needed for congestion. 10/11/16   Kem Parkinson, PA-C    Family History Family History  Problem Relation Age of Onset  . Diabetes Mother   . Hypertension Mother   . Diabetes Father   . Cancer Father   . Heart failure Other   . Cancer Other     Social History Social History   Tobacco Use  . Smoking status: Current Every Day Smoker    Packs/day: 0.50    Types: Cigarettes  . Smokeless tobacco: Never Used  Substance Use Topics  . Alcohol use: Yes    Comment: rarely  . Drug use: No     Allergies   Hydrocodone and Sulfa antibiotics   Review of Systems Review of Systems  Constitutional: Negative for chills and fever.  HENT: Positive for congestion, postnasal drip, sore throat and voice change. Negative for drooling, sinus pressure, sinus pain and  trouble swallowing.   Respiratory: Negative for cough, shortness of breath and wheezing.   Cardiovascular: Negative for chest pain.  Gastrointestinal: Negative for abdominal pain, constipation, diarrhea and vomiting.  Genitourinary: Negative for dysuria and flank pain.  Musculoskeletal: Positive for neck pain (chronic). Negative for neck stiffness.  Skin: Negative for rash.  Neurological: Positive for headaches. Negative for dizziness, light-headedness and numbness.      Physical Exam Updated Vital Signs BP 123/81 (BP Location: Right Arm)   Pulse 78   Temp 97.9 F (36.6 C) (Oral)   Resp 18   Ht 5\' 6"  (1.676 m)   Wt 81.6 kg (180 lb)   SpO2 98%   BMI 29.05 kg/m   Physical Exam  Constitutional: She appears well-developed and well-nourished. No distress.  Nontoxic appearing. No acute distress  HENT:  Head: Normocephalic and atraumatic.  Right Ear: Hearing, tympanic membrane and ear canal normal. No middle ear effusion.  Left Ear: Hearing, tympanic membrane and ear canal normal.  No middle ear effusion.  Mouth/Throat: Uvula is midline and mucous membranes are normal. No oral lesions. No uvula swelling. Posterior oropharyngeal erythema present. No oropharyngeal exudate, posterior oropharyngeal edema or tonsillar abscesses. Tonsils are 0 on the right. Tonsils are 0 on the left. No tonsillar exudate.  No posterior auricular lymphadenopathy.  No mastoid tenderness. Pt is whispering on exam. No hot potato voice.  Eyes: Conjunctivae and EOM are normal. Pupils are equal, round, and reactive to light.  Neck: Normal range of motion. Neck supple.  Some cervical adenopathy anteriorly. No nuchal rigidity. Able to fully flex chin to chest. Mild paraspinous and trapezious TTP to the right with muscle spasm.  Cardiovascular: Normal rate, regular rhythm and normal heart sounds.  No murmur heard. Pulmonary/Chest: Effort normal and breath sounds normal. No stridor. No respiratory distress. She has no  wheezes.  Abdominal: Soft. Bowel sounds are normal. There is no tenderness.  Musculoskeletal: She exhibits no edema.  Neurological: She is alert.  Cranial Nerves:  II:  pupils equal, round, reactive to light III,IV, VI: ptosis not present, extra-ocular motions intact bilaterally  V,VII: smile symmetric, facial light touch sensation equal VIII: hearing grossly normal to voice  X: uvula elevates symmetrically  XI: bilateral shoulder shrug symmetric and strong XII: midline tongue extension without fassiculations  Skin: Skin is warm and dry.  Psychiatric: She has a normal mood and affect.  Nursing note and vitals reviewed.    ED Treatments / Results  Labs (all labs ordered are listed, but only abnormal results are displayed) Labs Reviewed - No data to display  EKG  EKG Interpretation None       Radiology No results found.  Procedures Procedures (including  critical care time)  Medications Ordered in ED Medications  acetaminophen (TYLENOL) tablet 650 mg (650 mg Oral Given 10/13/17 0914)     Initial Impression / Assessment and Plan / ED Course  I have reviewed the triage vital signs and the nursing notes.  Pertinent labs & imaging results that were available during my care of the patient were reviewed by me and considered in my medical decision making (see chart for details).   Evaluated patient in the ED.  Vital signs are stable and.  Patient is nontoxic-appearing.  She is requesting some water.  Is able to tolerate p.o.  Will order Tylenol.  Discussed pt presentation and exam findings with Dr. Tomi Bamberger, who reviewed the patient's throat and agrees that patient likely has acute laryngitis and that there is no evidence of a peritonsillar abscess.   Final Clinical Impressions(s) / ED Diagnoses   Final diagnoses:  Laryngitis, acute    57 y/o female presenting with 1 day h/o sore throat and change in voice. VSS and patient is afebrile and nontoxic appearing. Is whispering  on exam but has no hot potato voice and no stridor. Some pharyngeal erythema, but no tonsillar swelling or exudates. Uvula midline. sxs likely secondary to viral laryngitis. Exam nonconcerning for strep pharyngitis or peritonsillar or retropharyngeal abscess. CENTOR score is 0. Advised hydration, humidification, voice rest and tylenol for sore throat. Advised PCP f/u and return precautions discussed. Pt understands reasons to return and is agreeable to f/u.   ED Discharge Orders        Ordered    acetaminophen (TYLENOL) 325 MG tablet  Every 6 hours PRN     10/13/17 0928       Rodney Booze, PA-C 10/13/17 3354    Dorie Rank, MD 10/15/17 1944

## 2017-10-13 NOTE — ED Triage Notes (Signed)
Pt reports sore throat since last night.  Pt whispering because it hurts to talk and swallow.  Reports cough and tenderness to neck.  Unsure if has had fever.

## 2017-10-13 NOTE — ED Provider Notes (Signed)
Medical screening examination/treatment/procedure(s) were conducted as a shared visit with non-physician practitioner(s) and myself.  I personally evaluated the patient during the encounter.  No signs of peritonsillar abscess on exam.  No significant tonsillar erythema or exudate.  Likely a viral illness   Dorie Rank, MD 10/13/17 [phone]

## 2017-10-13 NOTE — Discharge Instructions (Signed)
Today you were diagnosed with acute laryngitis.  Make sure that you are staying hydrated and drinking plenty of fluids.  You may use a humidifier to help with your symptoms.  Should also rest her voice over the next week.  He can treat your sore throat with over-the-counter Tylenol and ibuprofen.  Follow-up with primary care for recheck of your symptoms in 1 week and return to the ER if any new or worsening symptoms including any fevers, difficulty swallowing, or any new or worsening symptoms.

## 2017-10-26 ENCOUNTER — Emergency Department (HOSPITAL_COMMUNITY): Payer: Self-pay

## 2017-10-26 ENCOUNTER — Emergency Department (HOSPITAL_COMMUNITY)
Admission: EM | Admit: 2017-10-26 | Discharge: 2017-10-26 | Disposition: A | Discharge status: Home / Self Care | Payer: Self-pay | Attending: Emergency Medicine | Admitting: Emergency Medicine

## 2017-10-26 ENCOUNTER — Other Ambulatory Visit: Payer: Self-pay

## 2017-10-26 ENCOUNTER — Encounter (HOSPITAL_COMMUNITY): Payer: Self-pay

## 2017-10-26 DIAGNOSIS — J111 Influenza due to unidentified influenza virus with other respiratory manifestations: Secondary | ICD-10-CM | POA: Insufficient documentation

## 2017-10-26 DIAGNOSIS — F17210 Nicotine dependence, cigarettes, uncomplicated: Secondary | ICD-10-CM | POA: Insufficient documentation

## 2017-10-26 DIAGNOSIS — R69 Illness, unspecified: Secondary | ICD-10-CM

## 2017-10-26 MED ORDER — OSELTAMIVIR PHOSPHATE 75 MG PO CAPS: 75.0000 mg | ORAL_CAPSULE | Freq: Two times a day (BID) | ORAL | 0 refills | Status: DC

## 2017-10-26 MED ORDER — ONDANSETRON 8 MG PO TBDP
8.0000 mg | ORAL_TABLET | Freq: Once | ORAL | Status: AC
  Administered 2017-10-26: 8 mg via ORAL
  Filled 2017-10-26: qty 1

## 2017-10-26 MED ORDER — IBUPROFEN 400 MG PO TABS
400.0000 mg | ORAL_TABLET | Freq: Once | ORAL | Status: AC
  Administered 2017-10-26: 400 mg via ORAL
  Filled 2017-10-26: qty 1

## 2017-10-26 MED ORDER — ONDANSETRON HCL 4 MG PO TABS: 4.0000 mg | ORAL_TABLET | Freq: Three times a day (TID) | ORAL | 0 refills | Status: DC | PRN

## 2017-10-26 NOTE — Discharge Instructions (Signed)
Drink plenty of fluids. Take ibuprofen and/or acetaminophen as needed for fever or aching.

## 2017-10-26 NOTE — ED Triage Notes (Signed)
Cough, congestion, fever, vomiting, body aches onset yesterday

## 2017-10-26 NOTE — ED Notes (Signed)
Pt transported to xray 

## 2017-10-26 NOTE — ED Provider Notes (Signed)
[facility] EMERGENCY DEPARTMENT Provider Note   CSN: [id number] Arrival date & time: 10/26/17  0542     History   Chief Complaint Chief Complaint  Patient presents with  . Fever    HPI Alexa Johnson is a 57 y.o. female.  The history is provided by the patient.  Fever    She was in her usual state of health until yesterday afternoon when she started having chills and generalized body aches.  She checked her temperature and it was never higher than 99.8, but states her normal body temperature is under 97.  She denies any sweats.  There has been some slight nasal congestion with clear rhinorrhea, and cough productive of thick yellow sputum.  She tried taking some ibuprofen and Claritin, but vomited after taking it.  She is not having any difficulty with ongoing nausea and has not had any diarrhea.  She is not aware of any sick contacts.  She did not receive the influenza immunization this year.  Past Medical History:  Diagnosis Date  . Migraines   . Pneumonia   . Sinusitis     There are no active problems to display for this patient.   Past Surgical History:  Procedure Laterality Date  . CESAREAN SECTION      OB History    Gravida Para Term Preterm AB Living   3 3 3          SAB TAB Ectopic Multiple Live Births                   Home Medications    Prior to Admission medications   Medication Sig Start Date End Date Taking? Authorizing Provider  acetaminophen (TYLENOL) 325 MG tablet Take 2 tablets (650 mg total) by mouth every 6 (six) hours as needed. 10/13/17   Couture, Cortni S, PA-C  aspirin-acetaminophen-caffeine (EXCEDRIN EXTRA STRENGTH) [phone] MG tablet Take 1-2 tablets by mouth every 6 (six) hours as needed for headache.    [provider]  cyclobenzaprine (FLEXERIL) 10 MG tablet Take 1 tablet (10 mg total) by mouth 3 (three) times daily. 12/11/16   Lily Kocher, PA-C  dexamethasone (DECADRON) 4 MG tablet Take 1 tablet (4 mg total) by mouth 2  (two) times daily with a meal. 12/11/16   Lily Kocher, PA-C  diclofenac (VOLTAREN) 75 MG EC tablet Take 1 tablet (75 mg total) by mouth 2 (two) times daily. 12/11/16   Lily Kocher, PA-C  fluticasone (FLONASE) 50 MCG/ACT nasal spray Place 2 sprays into both nostrils daily. 10/11/16   Triplett, Tammy, PA-C  ibuprofen (ADVIL,MOTRIN) 600 MG tablet Take 1 tablet (600 mg total) by mouth every 6 (six) hours as needed. Patient not taking: Reported on 12/11/2016 10/16/16   Isla Pence, MD  Misc Natural Products (SINUS FORMULA PO) Take 2 tablets by mouth daily as needed (for sinus relief).    [provider]  pseudoephedrine (SUDAFED) 60 MG tablet Take 1 tablet (60 mg total) by mouth every 6 (six) hours as needed for congestion. 10/11/16   Kem Parkinson, PA-C    Family History Family History  Problem Relation Age of Onset  . Diabetes Mother   . Hypertension Mother   . Diabetes Father   . Cancer Father   . Heart failure Other   . Cancer Other     Social History Social History   Tobacco Use  . Smoking status: Current Every Day Smoker    Packs/day: 0.50    Types: Cigarettes  . Smokeless  tobacco: Never Used  Substance Use Topics  . Alcohol use: Yes    Comment: rarely  . Drug use: No     Allergies   Hydrocodone and Sulfa antibiotics   Review of Systems Review of Systems  Constitutional: Positive for fever.  All other systems reviewed and are negative.    Physical Exam Updated Vital Signs BP 129/83 (BP Location: Right Arm)   Pulse (!) 108   Temp 99.9 F (37.7 C) (Oral)   Resp 17   Ht 5\' 6"  (1.676 m)   Wt 81.6 kg (180 lb)   SpO2 98%   BMI 29.05 kg/m   Physical Exam  Nursing note and vitals reviewed.  57 year old female, resting comfortably and in no acute distress. Vital signs are significant for mild tachycardia. Oxygen saturation is 98%, which is normal. Head is normocephalic and atraumatic. PERRLA, EOMI. Oropharynx is clear. Neck is nontender and supple  without adenopathy or JVD. Back is nontender and there is no CVA tenderness. Lungs are clear without rales, wheezes, or rhonchi. Chest is nontender. Heart has regular rate and rhythm without murmur. Abdomen is soft, flat, nontender without masses or hepatosplenomegaly and peristalsis is normoactive. Extremities have no cyanosis or edema, full range of motion is present. Skin is warm and dry without rash. Neurologic: Mental status is normal, cranial nerves are intact, there are no motor or sensory deficits.  ED Treatments / Results   Radiology Dg Chest 2 View  Result Date: 10/26/2017 CLINICAL DATA:  Cough, congestion and fever. EXAM: CHEST  2 VIEW COMPARISON:  None. FINDINGS: Cardiomediastinal silhouette is normal. No pleural effusions or focal consolidations. Mild interstitial prominence. Slightly increased lung volumes. Trachea projects midline and there is no pneumothorax. Soft tissue planes and included osseous structures are non-suspicious. IMPRESSION: Mild interstitial prominence, possible atypical infection without focal consolidation. Probable component of COPD. Electronically Signed   By: Elon Alas M.D.   On: 10/26/2017 06:24    Procedures Procedures (including critical care time)  Medications Ordered in ED Medications  ibuprofen (ADVIL,MOTRIN) tablet 400 mg (400 mg Oral Given 10/26/17 0609)  ondansetron (ZOFRAN-ODT) disintegrating tablet 8 mg (8 mg Oral Given 10/26/17 0610)     Initial Impression / Assessment and Plan / ED Course  I have reviewed the triage vital signs and the nursing notes.  Pertinent imaging results that were available during my care of the patient were reviewed by me and considered in my medical decision making (see chart for details).  Probable influenza.  Will get chest x-ray to rule out pneumonia.  Old records are reviewed, and she does have a prior ED visit for a respiratory tract infection.  She is within the timeframe where antiviral treatment  for influenza might be helpful.  Chest x-ray shows no evidence of pneumonia.  She is discharged with prescription for oseltamivir, told to drink plenty of fluids and use over-the-counter analgesics and antipyretics as needed.  Return precautions discussed.  Final Clinical Impressions(s) / ED Diagnoses   Final diagnoses:  Influenza-like illness    ED Discharge Orders        Ordered    ondansetron (ZOFRAN) 4 MG tablet  Every 8 hours PRN     10/26/17 0635    oseltamivir (TAMIFLU) 75 MG capsule  Every 12 hours     67/12/45 8099       Delora Fuel, MD 83/38/25 [phone]

## 2017-10-29 MED FILL — Ondansetron HCl Tab 4 MG: ORAL | Qty: 4 | Status: AC

## 2018-04-04 ENCOUNTER — Emergency Department (HOSPITAL_COMMUNITY)
Admission: EM | Admit: 2018-04-04 | Discharge: 2018-04-04 | Disposition: A | Discharge status: Home / Self Care | Payer: Self-pay | Attending: Emergency Medicine | Admitting: Emergency Medicine

## 2018-04-04 ENCOUNTER — Other Ambulatory Visit: Payer: Self-pay

## 2018-04-04 ENCOUNTER — Encounter (HOSPITAL_COMMUNITY): Payer: Self-pay

## 2018-04-04 ENCOUNTER — Emergency Department (HOSPITAL_COMMUNITY): Payer: Self-pay

## 2018-04-04 DIAGNOSIS — M25519 Pain in unspecified shoulder: Secondary | ICD-10-CM

## 2018-04-04 DIAGNOSIS — Z79899 Other long term (current) drug therapy: Secondary | ICD-10-CM | POA: Insufficient documentation

## 2018-04-04 DIAGNOSIS — M5412 Radiculopathy, cervical region: Secondary | ICD-10-CM | POA: Insufficient documentation

## 2018-04-04 DIAGNOSIS — F17210 Nicotine dependence, cigarettes, uncomplicated: Secondary | ICD-10-CM | POA: Insufficient documentation

## 2018-04-04 DIAGNOSIS — M542 Cervicalgia: Secondary | ICD-10-CM

## 2018-04-04 MED ORDER — DEXAMETHASONE SODIUM PHOSPHATE 10 MG/ML IJ SOLN
10.0000 mg | Freq: Once | INTRAMUSCULAR | Status: AC
  Administered 2018-04-04: 10 mg via INTRAMUSCULAR
  Filled 2018-04-04: qty 1

## 2018-04-04 MED ORDER — METHOCARBAMOL 500 MG PO TABS: 500.0000 mg | ORAL_TABLET | Freq: Two times a day (BID) | ORAL | 0 refills | Status: DC

## 2018-04-04 MED ORDER — METHYLPREDNISOLONE 4 MG PO TBPK: ORAL_TABLET | ORAL | 0 refills | Status: DC

## 2018-04-04 NOTE — ED Triage Notes (Signed)
Pt is having left shoulder pain that is radiating down left forearm. Pt is also having swelling to left hand. Pt states no injury to area either. No chest pain or cardiac issues.

## 2018-04-04 NOTE — ED Provider Notes (Signed)
[facility] EMERGENCY DEPARTMENT Provider Note   CSN: [id number] Arrival date & time: 04/04/18  0940     History   Chief Complaint Chief Complaint  Patient presents with  . Shoulder Pain    HPI Alexa Johnson is a 57 y.o. female.  Alexa Johnson is a 57 y.o. Female with a history of migraines, sciatica and sinusitis, presents to the emergency department for evaluation of left shoulder and neck pain.  Patient reports her left shoulder neck has felt sore for the last few days but became significantly worse last night while working at [employer] when she was up moving around frequently.  She reports her arm had intermittent paresthesias with slight numbness and tingling shooting down her arm into her third fourth and fifth fingers.  She reports some mild weakness in the left hand.  Patient reports pain has been constant, she tried Excedrin and Motrin while at work with minimal improvement has not tried anything else to treat her symptoms, she does take gabapentin regularly for her sciatica, but has not taken this today.  Patient denies any pain, paresthesias or weakness on the right side.  Patient denies any associated chest pain, palpitations or shortness of breath.  She denies any personal history of cardiac events, does have family history and her older family members.  Patient is not diabetic, does not have history of hypertension or hyperlipidemia.  She is a current smoker of half a pack per day.  No associated headaches, numbness or weakness in her lower extremities.     Past Medical History:  Diagnosis Date  . Migraines   . Pneumonia   . Sinusitis     There are no active problems to display for this patient.   Past Surgical History:  Procedure Laterality Date  . CESAREAN SECTION       OB History    Gravida  3   Para  3   Term  3   Preterm      AB      Living        SAB      TAB      Ectopic      Multiple      Live Births               Home  Medications    Prior to Admission medications   Medication Sig Start Date End Date Taking? Authorizing Provider  acetaminophen (TYLENOL) 325 MG tablet Take 2 tablets (650 mg total) by mouth every 6 (six) hours as needed. 10/13/17   Couture, Cortni S, PA-C  aspirin-acetaminophen-caffeine (EXCEDRIN EXTRA STRENGTH) [phone] MG tablet Take 1-2 tablets by mouth every 6 (six) hours as needed for headache.    [provider]  fluticasone (FLONASE) 50 MCG/ACT nasal spray Place 2 sprays into both nostrils daily. 10/11/16   Triplett, Tammy, PA-C  methocarbamol (ROBAXIN) 500 MG tablet Take 1 tablet (500 mg total) by mouth 2 (two) times daily. 04/04/18   Jacqlyn Larsen, PA-C  methylPREDNISolone (MEDROL DOSEPAK) 4 MG TBPK tablet Take as directed 04/04/18   Jacqlyn Larsen, PA-C  Misc Natural Products (SINUS FORMULA PO) Take 2 tablets by mouth daily as needed (for sinus relief).    [provider]  ondansetron (ZOFRAN) 4 MG tablet Take 1 tablet (4 mg total) by mouth every 8 (eight) hours as needed for nausea or vomiting. 7/78/24   Delora Fuel, MD  oseltamivir (TAMIFLU) 75 MG capsule Take 1 capsule (75 mg total)  by mouth every 12 (twelve) hours. 5/64/33   Delora Fuel, MD  pseudoephedrine (SUDAFED) 60 MG tablet Take 1 tablet (60 mg total) by mouth every 6 (six) hours as needed for congestion. 10/11/16   Kem Parkinson, PA-C    Family History Family History  Problem Relation Age of Onset  . Diabetes Mother   . Hypertension Mother   . Diabetes Father   . Cancer Father   . Heart failure Other   . Cancer Other     Social History Social History   Tobacco Use  . Smoking status: Current Every Day Smoker    Packs/day: 0.50    Types: Cigarettes  . Smokeless tobacco: Never Used  Substance Use Topics  . Alcohol use: Yes    Comment: rarely  . Drug use: No     Allergies   Hydrocodone and Sulfa antibiotics   Review of Systems Review of Systems  Constitutional: Negative for chills and  fever.  HENT: Negative.   Respiratory: Negative for cough and shortness of breath.   Cardiovascular: Negative for chest pain and palpitations.  Gastrointestinal: Negative for abdominal pain, nausea and vomiting.  Genitourinary: Negative for dysuria and frequency.  Musculoskeletal: Positive for arthralgias and neck pain. Negative for back pain, gait problem, joint swelling and neck stiffness.  Skin: Negative for color change and rash.  Neurological: Positive for weakness. Negative for numbness.       Paresthesias left arm     Physical Exam Updated Vital Signs BP 115/77 (BP Location: Right Arm)   Pulse 75   Temp 98.5 F (36.9 C) (Oral)   Resp 14   Ht 5\' 6"  (1.676 m)   Wt 81.6 kg (180 lb)   SpO2 94%   BMI 29.05 kg/m   Physical Exam  Constitutional: She appears well-developed and well-nourished. No distress.  HENT:  Head: Normocephalic and atraumatic.  Mouth/Throat: Oropharynx is clear and moist.  Eyes: Right eye exhibits no discharge. Left eye exhibits no discharge.  Neck: Neck supple.  Tenderness to palpation over the left paraspinal muscles without palpable deformity, tenderness extends across the left trapezius muscle  Cardiovascular: Normal rate, regular rhythm, normal heart sounds and intact distal pulses. Exam reveals no gallop and no friction rub.  No murmur heard. Pulmonary/Chest: Effort normal and breath sounds normal. No respiratory distress.  Respirations equal and unlabored, patient able to speak in full sentences, lungs clear to auscultation bilaterally  Abdominal: Soft. Bowel sounds are normal. She exhibits no distension. There is no tenderness.  Musculoskeletal:  Tenderness to palpation over the left shoulder without palpable deformity, no overlying erythema or warmth, range of motion intact in all directions, normal range of motion at the elbow and wrist, no tenderness throughout the upper and lower arm.  Patient has 4.5/5 strength in the left hand with grip, push  and pull when compared to the right side.  Sensation intact throughout the arm.  2+ radial pulse and good capillary refill laterally  Neurological: She is alert. Coordination normal.  Skin: Skin is warm and dry. She is not diaphoretic.  Psychiatric: She has a normal mood and affect. Her behavior is normal.  Nursing note and vitals reviewed.    ED Treatments / Results  Labs (all labs ordered are listed, but only abnormal results are displayed) Labs Reviewed - No data to display  EKG EKG Interpretation  Date/Time:  Thursday April 04 2018 09:57:01 EDT Ventricular Rate:  76 PR Interval:    QRS Duration: 82 QT Interval:  387 QTC Calculation: 436 R Axis:   77 Text Interpretation:  Sinus rhythm Low voltage, precordial leads No old tracing to compare Confirmed by Noemi Chapel [phone]) on 04/04/2018 10:18:28 AM   Radiology Dg Cervical Spine Complete  Result Date: 04/04/2018 CLINICAL DATA:  Cervicalgia with left-sided radicular symptoms EXAM: CERVICAL SPINE - COMPLETE 4+ VIEW COMPARISON:  None. FINDINGS: Frontal, lateral, open-mouth odontoid, and bilateral oblique views were obtained. There is no fracture or spondylolisthesis. Prevertebral soft tissues and predental space regions are normal. There is moderately severe disc space narrowing at C4-5 and C5-6. There is moderate disc space narrowing at C6-7. There is facet hypertrophy with exit foraminal narrowing at C3-4, C4-5, and C5-6 bilaterally. No erosive changes. There is reversal of lordotic curvature. Lung apices are clear. IMPRESSION: Osteoarthritic change at several levels. Reversal of lordotic curvature is likely indicative of chronic muscle spasm. No fracture or spondylolisthesis. Electronically Signed   By: Lowella Grip III M.D.   On: 04/04/2018 11:37   Dg Shoulder Left  Result Date: 04/04/2018 CLINICAL DATA:  Pain EXAM: LEFT SHOULDER - 2+ VIEW COMPARISON:  None. FINDINGS: Oblique, Y scapular, and axillary images obtained. There is  no fracture or dislocation. There is mild generalized osteoarthritic change. No erosive change. Visualized left lung clear. IMPRESSION: Mild generalized osteoarthritic change.  No fracture or dislocation. Electronically Signed   By: Lowella Grip III M.D.   On: 04/04/2018 11:38    Procedures Procedures (including critical care time)  Medications Ordered in ED Medications  dexamethasone (DECADRON) injection 10 mg (10 mg Intramuscular Given 04/04/18 1048)     Initial Impression / Assessment and Plan / ED Course  I have reviewed the triage vital signs and the nursing notes.  Pertinent labs & imaging results that were available during my care of the patient were reviewed by me and considered in my medical decision making (see chart for details).  Patient presents for evaluation of pain in the left neck and shoulder that radiates down the arm causing intermittent paresthesias and mild weakness in the left hand.  Symptoms worsened while she was up working last night.  History of similar symptoms on the other side has been told in the past that she has some disc compression in her neck that could cause a cervical radiculopathy.  Patient has no associated chest pain or shortness of breath and I have low suspicion for cardiac etiology of patient's pain, screening EKG without concerning ischemic changes.  No history of prior cardiac events, primary risk factor is smoking history.  Symptoms seem very much in line with cervical radiculopathy, will get x-rays of the cervical spine and left shoulder we will treat with Decadron in addition to Motrin patient took just prior to arrival, she also regularly takes gabapentin already for sciatica.  X-ray show multilevel degenerative changes in the cervical spine which could certainly explain patient's symptoms and account for a cervical radiculopathy.  Left shoulder x-ray without concerning acute changes, she does have some mild generalized osteoarthritic changes.   Pain improved somewhat with steroids.  Will discharge with steroids and muscle relaxers, patient to begin using NSAIDs after she completes steroid course.  She is to follow-up with Dr. Arnoldo Morale with neurosurgery and spine for further evaluation as well as her primary care doctor.  Return precautions discussed.  Patient expresses understanding and is in agreement with plan.  Final Clinical Impressions(s) / ED Diagnoses   Final diagnoses:  Neck and shoulder pain  Cervical radiculopathy    ED  Discharge Orders        Ordered    methylPREDNISolone (MEDROL DOSEPAK) 4 MG TBPK tablet     04/04/18 1156    methocarbamol (ROBAXIN) 500 MG tablet  2 times daily     04/04/18 1156       Jacqlyn Larsen, [place] 04/04/18 1654    Noemi Chapel, MD 04/05/18 [phone]

## 2018-04-04 NOTE — Discharge Instructions (Signed)
Your symptoms are likely due to a cervical radiculopathy which is a pinched nerve in the neck, your x-ray of the neck shows several degenerative changes in the cervical spine that could be causing this.  Please take steroids for the next 5 days as directed.  After you complete this course of steroids you may take Motrin, Tylenol.  You may also use Robaxin, do not take this medication before driving as it can cause drowsiness.  You may also use over-the-counter salon pas lidocaine patches over the neck and shoulder to provide localized relief.  If symptoms are not improving please follow-up with your regular doctor and the spine specialist for further evaluation.  Return to the emergency department if you have severe weakness or numbness in one or both arms significantly worsened pain, fevers or any other new or concerning symptoms.

## 2019-02-06 IMAGING — DX DG HIP (WITH OR WITHOUT PELVIS) 2-3V*L*
3 series · 3 of 3 positions shown · non-contrast
Comparison: None.

CLINICAL DATA: Pain for 4 days

EXAM:
DG HIP (WITH OR WITHOUT PELVIS) 2-3V LEFT

[pelvis ap]
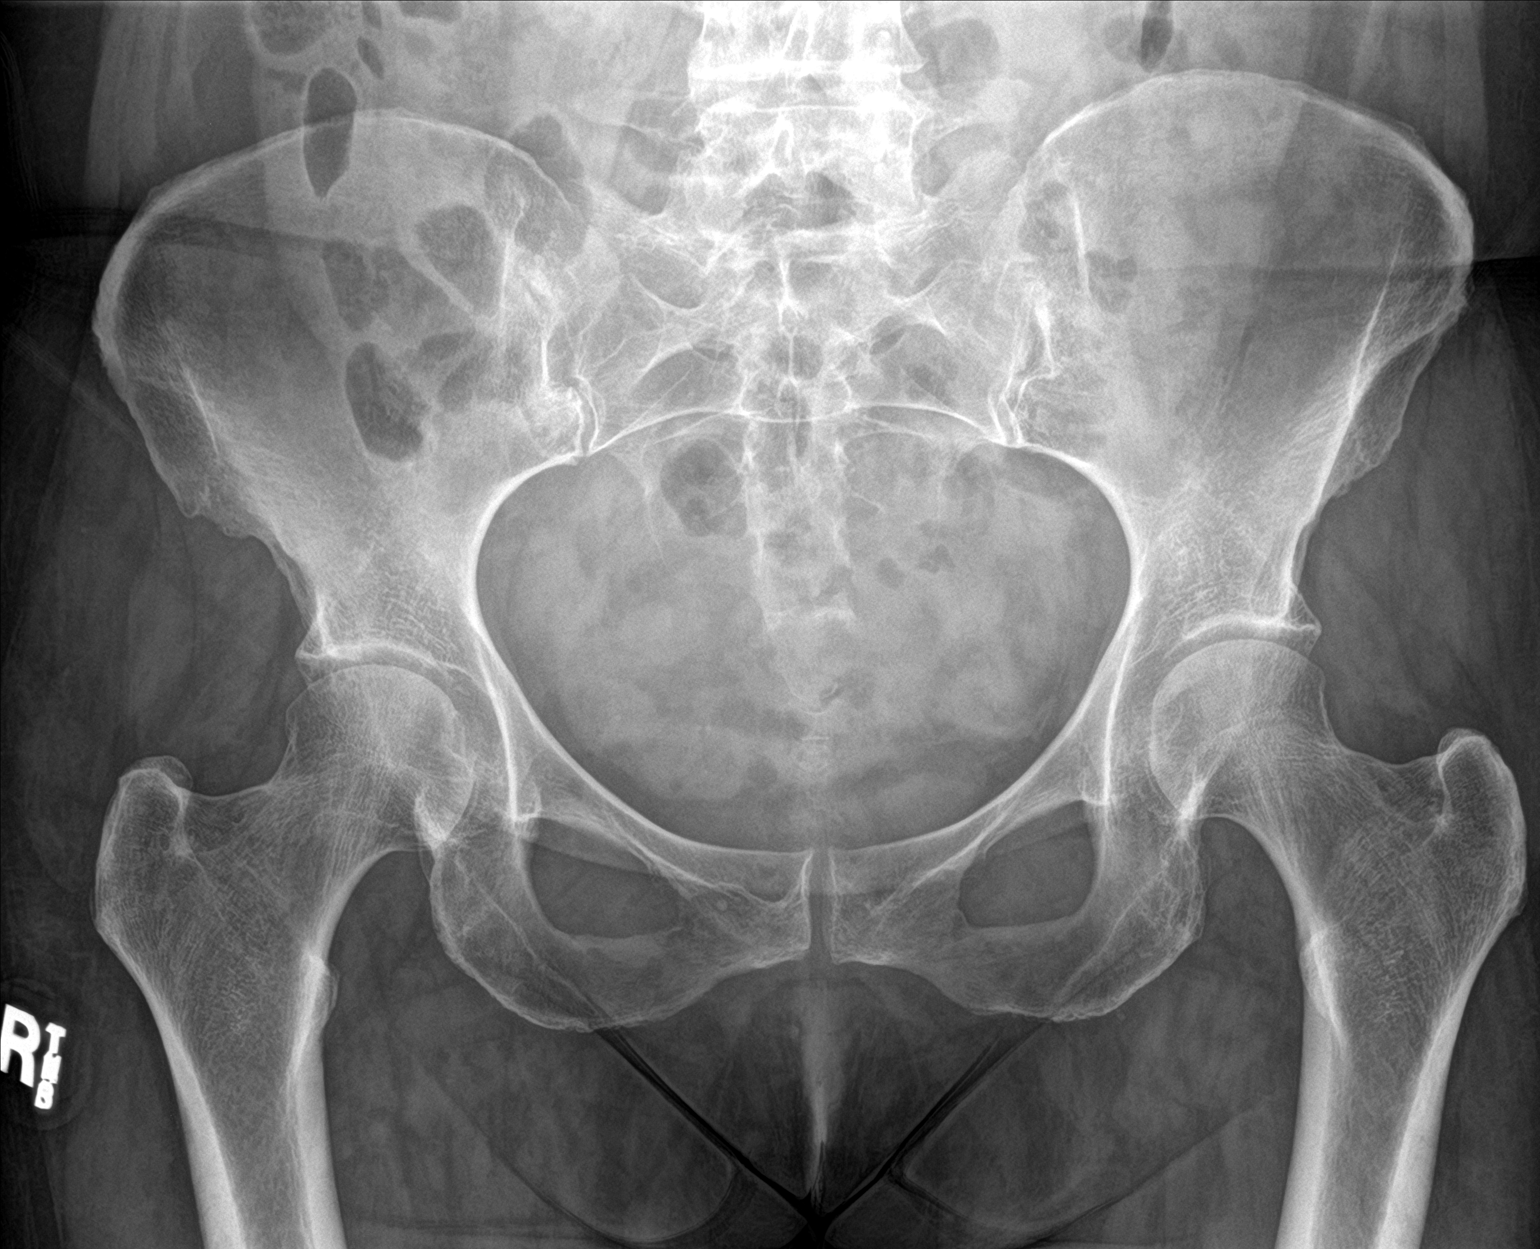

[hip ap]
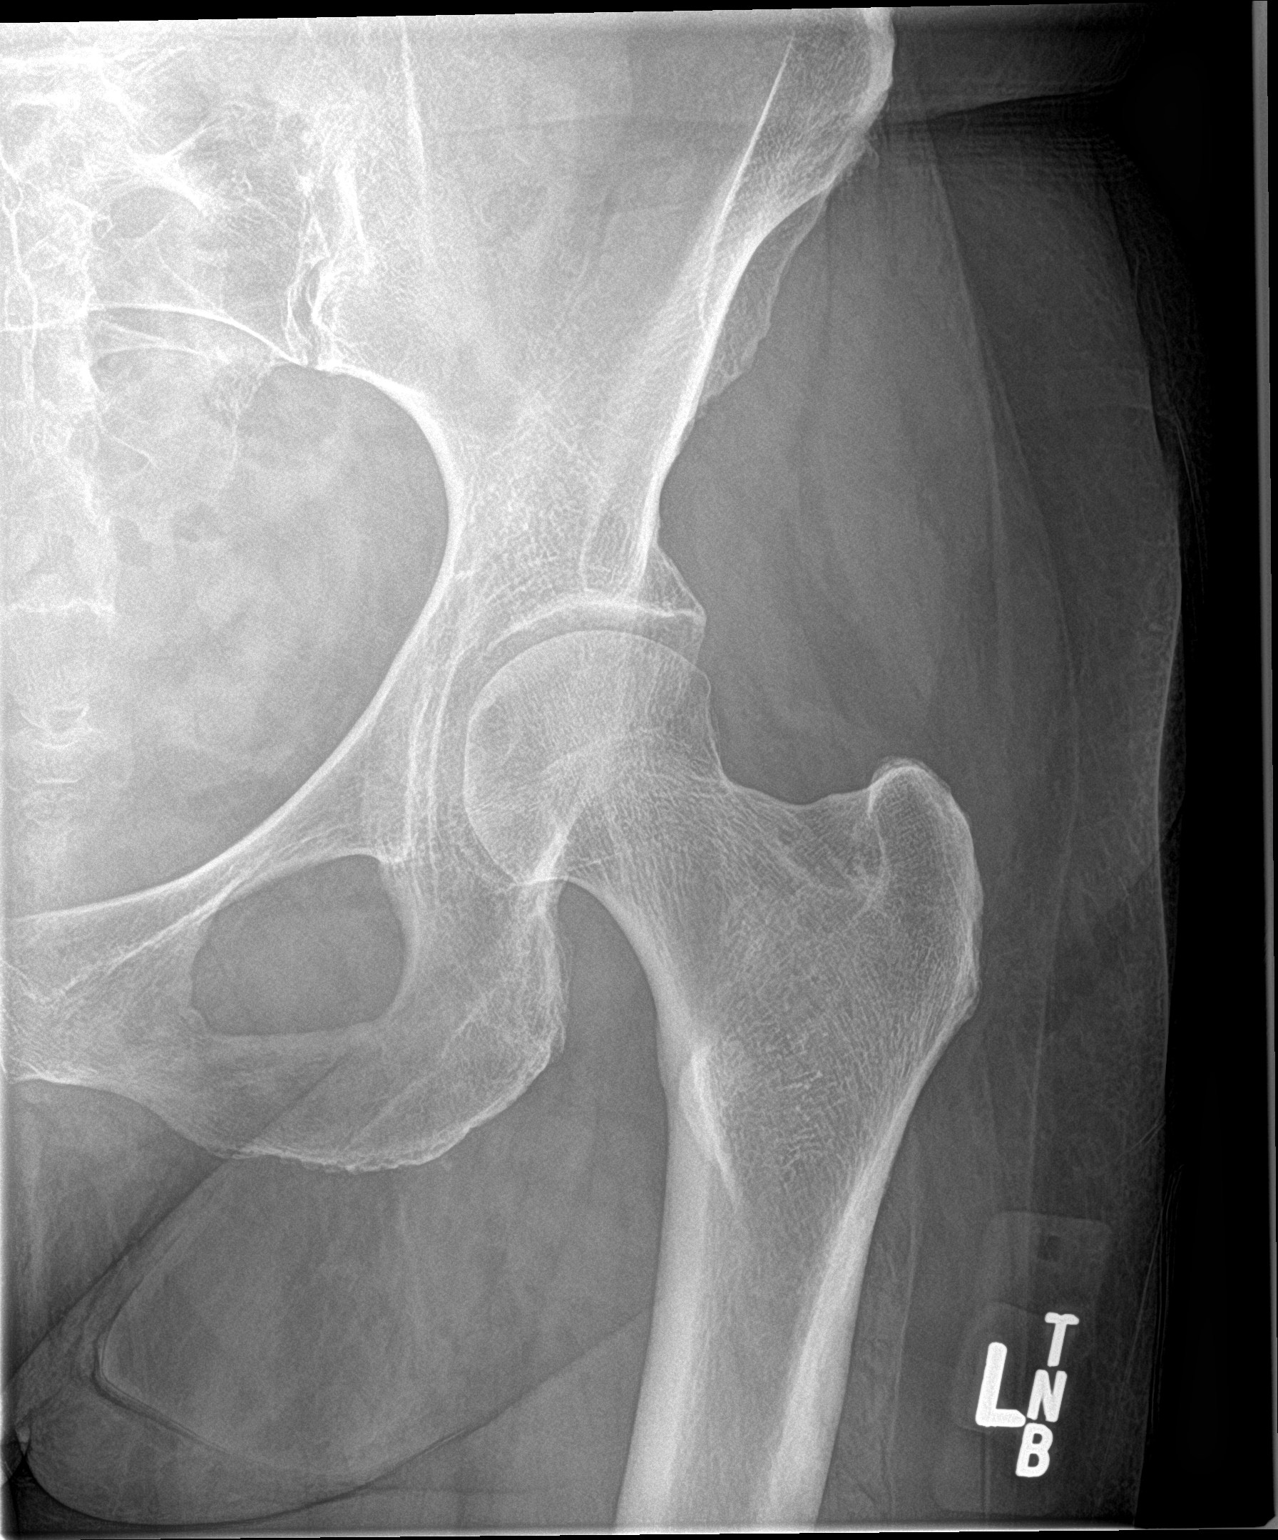

[hip lat]
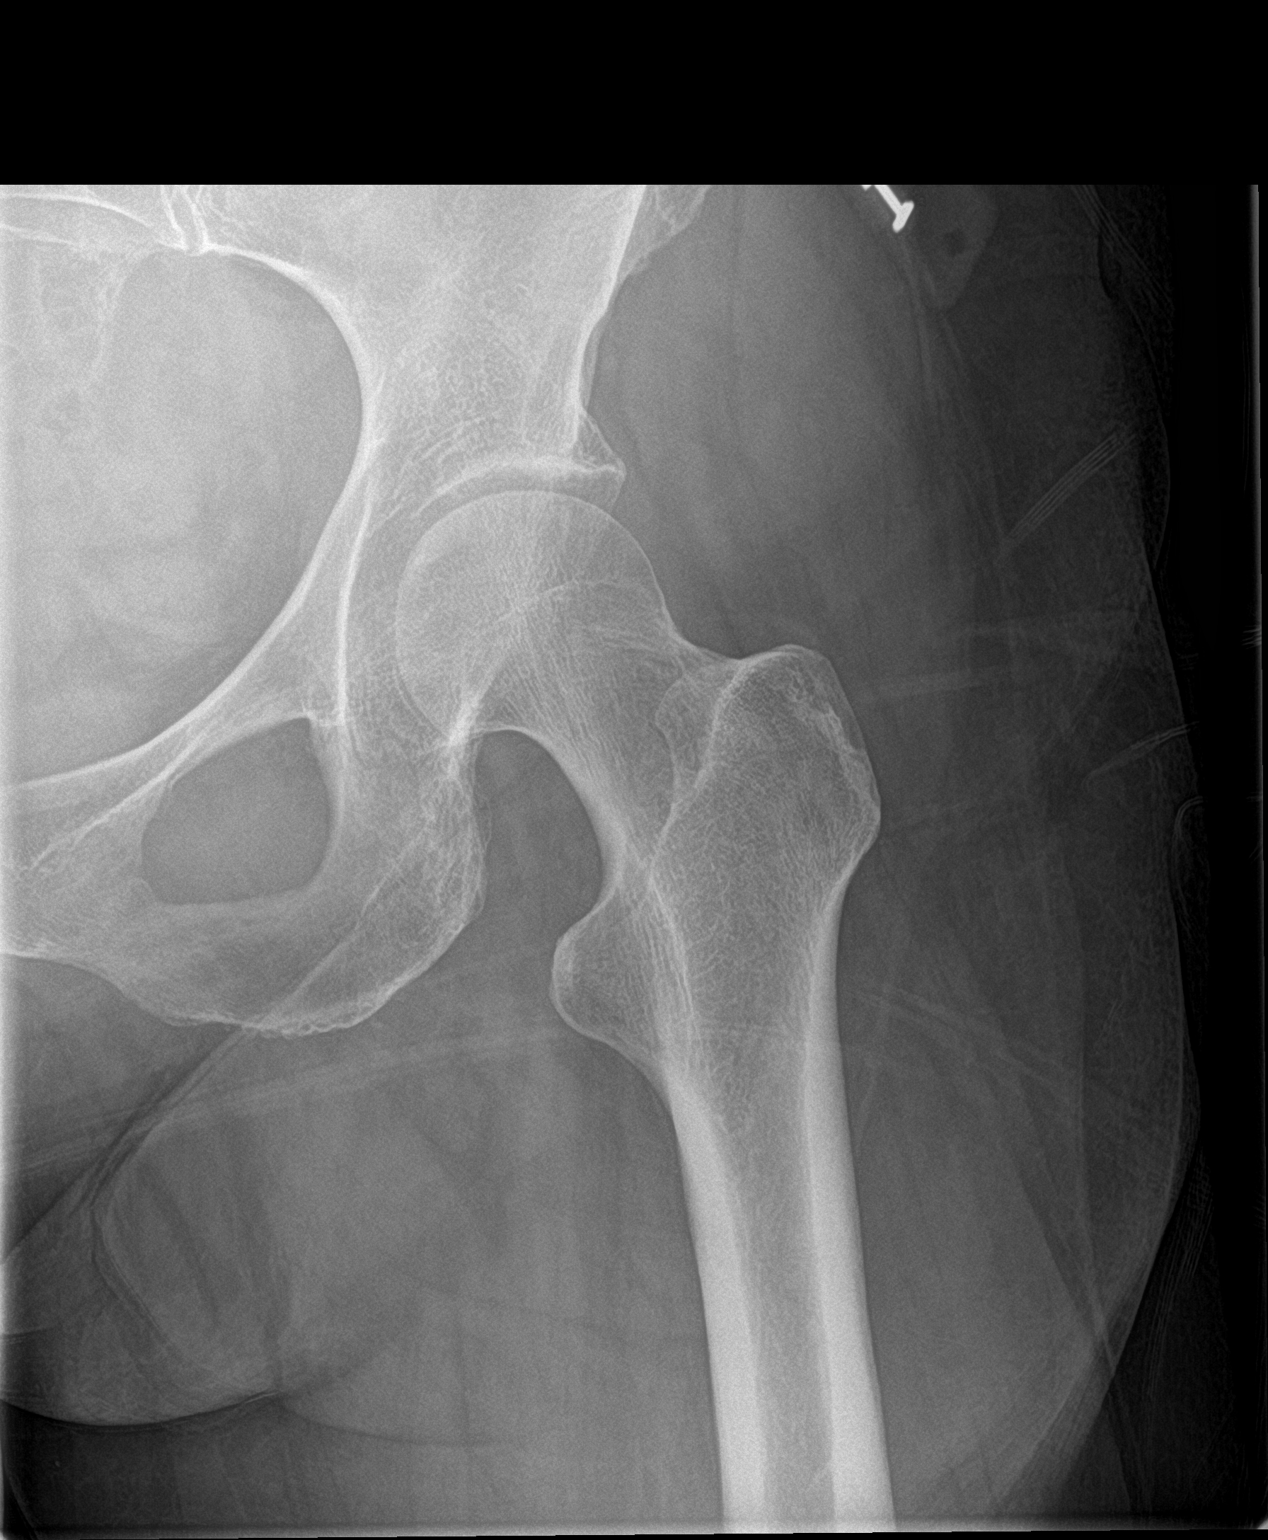

[3 of 3 positions shown; findings below may reference images not displayed]

FINDINGS: Frontal pelvis as well as frontal and lateral left hip images were
obtained. There is no fracture or dislocation. Joint spaces appear
normal. No erosive change.
IMPRESSION: No fracture or dislocation.  No appreciable arthropathy.

## 2019-10-27 ENCOUNTER — Ambulatory Visit: Payer: Self-pay | Attending: Internal Medicine

## 2019-10-27 ENCOUNTER — Other Ambulatory Visit: Payer: Self-pay

## 2019-10-27 DIAGNOSIS — Z20822 Contact with and (suspected) exposure to covid-19: Secondary | ICD-10-CM | POA: Insufficient documentation

## 2019-10-28 LAB — NOVEL CORONAVIRUS, NAA: SARS-CoV-2, NAA: NOT DETECTED

## 2020-05-11 ENCOUNTER — Other Ambulatory Visit (HOSPITAL_COMMUNITY): Payer: Self-pay | Admitting: *Deleted

## 2020-05-11 DIAGNOSIS — R590 Localized enlarged lymph nodes: Secondary | ICD-10-CM

## 2020-05-19 ENCOUNTER — Ambulatory Visit (HOSPITAL_COMMUNITY): Payer: Self-pay

## 2020-05-20 ENCOUNTER — Ambulatory Visit (HOSPITAL_COMMUNITY)
Admission: RE | Admit: 2020-05-20 | Discharge: 2020-05-20 | Disposition: A | Discharge status: Home / Self Care | Payer: Self-pay | Source: Ambulatory Visit | Attending: *Deleted | Admitting: *Deleted

## 2020-05-20 ENCOUNTER — Other Ambulatory Visit: Payer: Self-pay

## 2020-05-20 DIAGNOSIS — R590 Localized enlarged lymph nodes: Secondary | ICD-10-CM | POA: Insufficient documentation

## 2020-08-18 ENCOUNTER — Emergency Department (HOSPITAL_COMMUNITY)
Admission: EM | Admit: 2020-08-18 | Discharge: 2020-08-19 | Disposition: A | Discharge status: Home / Self Care | Payer: Self-pay | Attending: Emergency Medicine | Admitting: Emergency Medicine

## 2020-08-18 ENCOUNTER — Emergency Department (HOSPITAL_COMMUNITY): Payer: Self-pay

## 2020-08-18 ENCOUNTER — Encounter (HOSPITAL_COMMUNITY): Payer: Self-pay | Admitting: Emergency Medicine

## 2020-08-18 ENCOUNTER — Other Ambulatory Visit: Payer: Self-pay

## 2020-08-18 DIAGNOSIS — R000 Tachycardia, unspecified: Secondary | ICD-10-CM | POA: Insufficient documentation

## 2020-08-18 DIAGNOSIS — Z20822 Contact with and (suspected) exposure to covid-19: Secondary | ICD-10-CM | POA: Insufficient documentation

## 2020-08-18 DIAGNOSIS — N3001 Acute cystitis with hematuria: Secondary | ICD-10-CM | POA: Insufficient documentation

## 2020-08-18 DIAGNOSIS — F17210 Nicotine dependence, cigarettes, uncomplicated: Secondary | ICD-10-CM | POA: Insufficient documentation

## 2020-08-18 MED ORDER — LACTATED RINGERS IV BOLUS
1000.0000 mL | Freq: Once | INTRAVENOUS | Status: AC
  Administered 2020-08-18: 1000 mL via INTRAVENOUS

## 2020-08-18 MED ORDER — ACETAMINOPHEN 500 MG PO TABS
1000.0000 mg | ORAL_TABLET | Freq: Once | ORAL | Status: AC
  Administered 2020-08-19: 1000 mg via ORAL
  Filled 2020-08-18: qty 2

## 2020-08-18 NOTE — ED Provider Notes (Signed)
[facility] EMERGENCY DEPARTMENT Provider Note   CSN: [id number] Arrival date & time: 08/18/20  1945     History Chief Complaint  Patient presents with  . Dysuria    Alexa Johnson is a 59 y.o. female.  Patient presents to the emergency department for evaluation of urinary frequency and dysuria.  Symptoms began earlier today.  Patient reports that she has been noticing blood in her urine and also has had back pain.  Patient does report nausea and vomiting this evening prior to coming to the ER.        Past Medical History:  Diagnosis Date  . Migraines   . Pneumonia   . Sinusitis     There are no problems to display for this patient.   Past Surgical History:  Procedure Laterality Date  . CESAREAN SECTION       OB History    Gravida  3   Para  3   Term  3   Preterm      AB      Living        SAB      IAB      Ectopic      Multiple      Live Births              Family History  Problem Relation Age of Onset  . Diabetes Mother   . Hypertension Mother   . Diabetes Father   . Cancer Father   . Heart failure Other   . Cancer Other     Social History   Tobacco Use  . Smoking status: Current Every Day Smoker    Packs/day: 0.50    Types: Cigarettes  . Smokeless tobacco: Never Used  Vaping Use  . Vaping Use: Never used  Substance Use Topics  . Alcohol use: Yes    Comment: rarely  . Drug use: No    Home Medications Prior to Admission medications   Medication Sig Start Date End Date Taking? Authorizing Provider  acetaminophen (TYLENOL) 325 MG tablet Take 2 tablets (650 mg total) by mouth every 6 (six) hours as needed. 10/13/17  Yes Couture, Cortni S, PA-C  albuterol (VENTOLIN HFA) 108 (90 Base) MCG/ACT inhaler Inhale 1-2 puffs into the lungs every 6 (six) hours as needed for wheezing or shortness of breath.   Yes [provider]  aspirin-acetaminophen-caffeine (EXCEDRIN EXTRA STRENGTH) [id number] MG tablet Take 1-2 tablets by  mouth every 6 (six) hours as needed for headache.   Yes [provider]  fluticasone (FLONASE) 50 MCG/ACT nasal spray Place 2 sprays into both nostrils daily. 10/11/16  Yes Triplett, Tammy, PA-C  gabapentin (NEURONTIN) 300 MG capsule Take 300 mg by mouth daily as needed (pain).  05/06/20  Yes [provider]  Misc Natural Products (SINUS FORMULA PO) Take 2 tablets by mouth daily as needed (for sinus relief).   Yes [provider]  Multiple Vitamin (MULTIVITAMIN) tablet Take 1 tablet by mouth daily.   Yes [provider]  Omega-3 Fatty Acids (FISH OIL) 1000 MG CAPS Take 1 capsule by mouth daily.   Yes [provider]  ondansetron (ZOFRAN) 4 MG tablet Take 1 tablet (4 mg total) by mouth every 8 (eight) hours as needed for nausea or vomiting. 3/82/50  Yes Delora Fuel, MD  psyllium (REGULOID) 0.52 g capsule Take 0.52 g by mouth daily.   Yes [provider]  cephALEXin (KEFLEX) 500 MG capsule Take 1 capsule (500 mg total)  by mouth 2 (two) times daily. 08/19/20   Orpah Greek, MD  methocarbamol (ROBAXIN) 500 MG tablet Take 1 tablet (500 mg total) by mouth 2 (two) times daily. Patient not taking: Reported on 08/18/2020 04/04/18   Jacqlyn Larsen, PA-C  methylPREDNISolone (MEDROL DOSEPAK) 4 MG TBPK tablet Take as directed Patient not taking: Reported on 08/18/2020 04/04/18   Jacqlyn Larsen, PA-C  oseltamivir (TAMIFLU) 75 MG capsule Take 1 capsule (75 mg total) by mouth every 12 (twelve) hours. Patient not taking: Reported on 05/20/8337 2/50/53   Delora Fuel, MD  pseudoephedrine (SUDAFED) 60 MG tablet Take 1 tablet (60 mg total) by mouth every 6 (six) hours as needed for congestion. Patient not taking: Reported on 08/18/2020 10/11/16   Kem Parkinson, PA-C    Allergies    Hydrocodone and Sulfa antibiotics  Review of Systems   Review of Systems  Gastrointestinal: Positive for nausea and vomiting.  Genitourinary: Positive for dysuria, frequency  and hematuria.  Musculoskeletal: Positive for back pain.  All other systems reviewed and are negative.   Physical Exam Updated Vital Signs BP 98/71   Pulse 94   Temp 99.6 F (37.6 C) (Oral)   Resp (!) 22   Ht 5\' 6"  (1.676 m)   Wt 86.2 kg   SpO2 95%   BMI 30.67 kg/m   Physical Exam Vitals and nursing note reviewed.  Constitutional:      General: She is not in acute distress.    Appearance: Normal appearance. She is well-developed.  HENT:     Head: Normocephalic and atraumatic.     Right Ear: Hearing normal.     Left Ear: Hearing normal.     Nose: Nose normal.  Eyes:     Conjunctiva/sclera: Conjunctivae normal.     Pupils: Pupils are equal, round, and reactive to light.  Cardiovascular:     Rate and Rhythm: Regular rhythm. Tachycardia present.     Heart sounds: S1 normal and S2 normal. No murmur heard.  No friction rub. No gallop.   Pulmonary:     Effort: Pulmonary effort is normal. No respiratory distress.     Breath sounds: Normal breath sounds.  Chest:     Chest wall: No tenderness.  Abdominal:     General: Bowel sounds are normal.     Palpations: Abdomen is soft.     Tenderness: There is no abdominal tenderness. There is no guarding or rebound. Negative signs include Murphy's sign and McBurney's sign.     Hernia: No hernia is present.  Musculoskeletal:        General: Normal range of motion.     Cervical back: Normal range of motion and neck supple.  Skin:    General: Skin is warm and dry.     Findings: No rash.  Neurological:     Mental Status: She is alert and oriented to person, place, and time.     GCS: GCS eye subscore is 4. GCS verbal subscore is 5. GCS motor subscore is 6.     Cranial Nerves: No cranial nerve deficit.     Sensory: No sensory deficit.     Coordination: Coordination normal.  Psychiatric:        Speech: Speech normal.        Behavior: Behavior normal.        Thought Content: Thought content normal.     ED Results / Procedures /  Treatments   Labs (all labs ordered are listed, but only abnormal results  are displayed) Labs Reviewed  URINALYSIS, ROUTINE W REFLEX MICROSCOPIC - Abnormal; Notable for the following components:      Result Value   APPearance HAZY (*)    Hgb urine dipstick LARGE (*)    Protein, ur 30 (*)    Leukocytes,Ua LARGE (*)    RBC / HPF >50 (*)    Non Squamous Epithelial 0-5 (*)    All other components within normal limits  COMPREHENSIVE METABOLIC PANEL - Abnormal; Notable for the following components:   Glucose, Bld 129 (*)    All other components within normal limits  CBC WITH DIFFERENTIAL/PLATELET - Abnormal; Notable for the following components:   WBC 16.9 (*)    Neutro Abs 13.6 (*)    Monocytes Absolute 1.2 (*)    Abs Immature Granulocytes 0.08 (*)    All other components within normal limits  CULTURE, BLOOD (SINGLE)  RESP PANEL BY RT-PCR (FLU A&B, COVID) ARPGX2  URINE CULTURE  LACTIC ACID, PLASMA  LACTIC ACID, PLASMA  PROTIME-INR  APTT  POC URINE PREG, ED    EKG EKG Interpretation  Date/Time:  Wednesday August 18 2020 23:40:59 EST Ventricular Rate:  101 PR Interval:    QRS Duration: 73 QT Interval:  339 QTC Calculation: 440 R Axis:   75 Text Interpretation: Sinus tachycardia Probable left atrial enlargement Low voltage, precordial leads Confirmed by Orpah Greek ([id number]) on 08/19/2020 12:01:37 AM   Radiology DG Chest Port 1 View  Result Date: 08/18/2020 CLINICAL DATA:  Possible sepsis EXAM: PORTABLE CHEST 1 VIEW COMPARISON:  10/26/2017 FINDINGS: Coarse chronic interstitial opacity. No consolidation or effusion. Normal cardiomediastinal silhouette. No pneumothorax. IMPRESSION: No active disease. Coarse chronic interstitial opacity. Electronically Signed   By: Donavan Foil M.D.   On: 08/18/2020 23:57   CT RENAL STONE STUDY  Result Date: 08/19/2020 CLINICAL DATA:  Hematuria EXAM: CT ABDOMEN AND PELVIS WITHOUT CONTRAST TECHNIQUE: Multidetector CT imaging of the  abdomen and pelvis was performed following the standard protocol without IV contrast. COMPARISON:  None. FINDINGS: Lower chest: The visualized heart size within normal limits. No pericardial fluid/thickening. No hiatal hernia. The visualized portions of the lungs are clear. Hepatobiliary: Although limited due to the lack of intravenous contrast, normal in appearance without gross focal abnormality. No evidence of calcified gallstones or biliary ductal dilatation. Pancreas:  Unremarkable.  No surrounding inflammatory changes. Spleen: Normal in size. Although limited due to the lack of intravenous contrast, normal in appearance. Adrenals/Urinary Tract: Both adrenal glands appear normal. The kidneys and collecting system appear normal without evidence of urinary tract calculus or hydronephrosis. There is diffuse wall thickening seen around the bladder with mild fat stranding changes seen superiorly. Stomach/Bowel: The stomach, small bowel, and colon are normal in appearance. No inflammatory changes or obstructive findings. appendix is normal. Vascular/Lymphatic: There are no enlarged abdominal or pelvic lymph nodes. No significant gross vascular findings are present given the lack of intravenous contrast. Reproductive: The uterus and adnexa are unremarkable. Other: No evidence of abdominal wall mass or hernia. Musculoskeletal: No acute or significant osseous findings. IMPRESSION: Findings suggestive of acute cystitis. No renal or collecting system calculi. Electronically Signed   By: Prudencio Pair M.D.   On: 08/19/2020 03:01    Procedures Procedures (including critical care time)  Medications Ordered in ED Medications  cefTRIAXone (ROCEPHIN) 2 g in sodium chloride 0.9 % 100 mL IVPB (has no administration in time range)  lactated ringers bolus 1,000 mL (0 mLs Intravenous Stopped 08/19/20 0237)  acetaminophen (TYLENOL) tablet  1,000 mg (1,000 mg Oral Given 08/19/20 0004)    ED Course  I have reviewed the triage  vital signs and the nursing notes.  Pertinent labs & imaging results that were available during my care of the patient were reviewed by me and considered in my medical decision making (see chart for details).    MDM Rules/Calculators/A&P                          Patient presented to the emergency department for evaluation of dysuria, urinary frequency and hematuria.  She does have evidence of urinary tract infection on urinalysis.  She was noted to be mildly tachycardic and tachypneic at arrival.  She had a low-grade fever.  This was treated with Tylenol.  Tachycardia has resolved with Tylenol and IV fluids.  CT was performed to evaluate for concomitant urinary stone.  No stones are noted, she does have findings consistent with acute cystitis.  No sequela of pyelonephritis or other abnormality seen on CT.  Patient administered 2 g of IV Rocephin.  She does not appear septic at this time, vital signs are improved and her lactic acids are normal.  As she has already received adequate antimicrobial therapy with IV Rocephin, will discharge with continued antibiotics and given return precautions.   Final Clinical Impression(s) / ED Diagnoses Final diagnoses:  Acute cystitis with hematuria    Rx / DC Orders ED Discharge Orders         Ordered    cephALEXin (KEFLEX) 500 MG capsule  2 times daily        08/19/20 0309           Orpah Greek, MD 08/19/20 [phone]

## 2020-08-18 NOTE — ED Triage Notes (Signed)
Pt c/o painful urination with blood in her urine beginning today.

## 2020-08-19 ENCOUNTER — Emergency Department (HOSPITAL_COMMUNITY): Payer: Self-pay

## 2020-08-19 LAB — COMPREHENSIVE METABOLIC PANEL
ALT: 28 U/L (ref 0–44)
AST: 25 U/L (ref 15–41)
Albumin: 4 g/dL (ref 3.5–5.0)
Alkaline Phosphatase: 52 U/L (ref 38–126)
Anion gap: 8 (ref 5–15)
BUN: 14 mg/dL (ref 6–20)
CO2: 25 mmol/L (ref 22–32)
Calcium: 9.1 mg/dL (ref 8.9–10.3)
Chloride: 103 mmol/L (ref 98–111)
Creatinine, Ser: 0.74 mg/dL (ref 0.44–1.00)
GFR, Estimated: >60 mL/min (ref >60)
Glucose, Bld: 129 mg/dL — ABNORMAL HIGH (ref 70–99)
Potassium: 3.9 mmol/L (ref 3.5–5.1)
Sodium: 136 mmol/L (ref 135–145)
Total Bilirubin: 0.9 mg/dL (ref 0.3–1.2)
Total Protein: 7 g/dL (ref 6.5–8.1)

## 2020-08-19 LAB — CBC WITH DIFFERENTIAL/PLATELET
Abs Immature Granulocytes: 0.08 10*3/uL — ABNORMAL HIGH (ref 0.00–0.07)
Basophils Absolute: 0.1 10*3/uL (ref 0.0–0.1)
Basophils Relative: 0 %
Eosinophils Absolute: 0.1 10*3/uL (ref 0.0–0.5)
Eosinophils Relative: 1 %
HCT: 40.6 % (ref 36.0–46.0)
Hemoglobin: 13.6 g/dL (ref 12.0–15.0)
Immature Granulocytes: 1 %
Lymphocytes Relative: 11 %
Lymphs Abs: 1.9 10*3/uL (ref 0.7–4.0)
MCH: 32.1 pg (ref 26.0–34.0)
MCHC: 33.5 g/dL (ref 30.0–36.0)
MCV: 95.8 fL (ref 80.0–100.0)
Monocytes Absolute: 1.2 10*3/uL — ABNORMAL HIGH (ref 0.1–1.0)
Monocytes Relative: 7 %
Neutro Abs: 13.6 10*3/uL — ABNORMAL HIGH (ref 1.7–7.7)
Neutrophils Relative %: 80 %
Platelets: 276 10*3/uL (ref 150–400)
RBC: 4.24 MIL/uL (ref 3.87–5.11)
RDW: 12.6 % (ref 11.5–15.5)
WBC: 16.9 10*3/uL — ABNORMAL HIGH (ref 4.0–10.5)
nRBC: 0 % (ref 0.0–0.2)

## 2020-08-19 LAB — RESP PANEL BY RT-PCR (FLU A&B, COVID) ARPGX2
Influenza A by PCR: NEGATIVE
Influenza B by PCR: NEGATIVE
SARS Coronavirus 2 by RT PCR: NEGATIVE

## 2020-08-19 LAB — URINALYSIS, ROUTINE W REFLEX MICROSCOPIC
Bacteria, UA: NONE SEEN
Bilirubin Urine: NEGATIVE
Glucose, UA: NEGATIVE mg/dL
Ketones, ur: NEGATIVE mg/dL
Nitrite: NEGATIVE
Protein, ur: 30 mg/dL — AB
RBC / HPF: >50 RBC/hpf — ABNORMAL HIGH (ref 0–5)
Specific Gravity, Urine: 1.014 (ref 1.005–1.030)
pH: 8 (ref 5.0–8.0)

## 2020-08-19 LAB — PROTIME-INR
INR: 1 (ref 0.8–1.2)
Prothrombin Time: 12.7 seconds (ref 11.4–15.2)

## 2020-08-19 LAB — LACTIC ACID, PLASMA
Lactic Acid, Venous: 1.3 mmol/L (ref 0.5–1.9)
Lactic Acid, Venous: 1.6 mmol/L (ref 0.5–1.9)

## 2020-08-19 LAB — APTT: aPTT: 26 seconds (ref 24–36)

## 2020-08-19 LAB — POC URINE PREG, ED: Preg Test, Ur: NEGATIVE

## 2020-08-19 MED ORDER — CEPHALEXIN 500 MG PO CAPS: 500.0000 mg | ORAL_CAPSULE | Freq: Two times a day (BID) | ORAL | 0 refills | Status: AC

## 2020-08-19 MED ORDER — SODIUM CHLORIDE 0.9 % IV SOLN
2.0000 g | Freq: Once | INTRAVENOUS | Status: AC
  Administered 2020-08-19: 2 g via INTRAVENOUS
  Filled 2020-08-19: qty 20

## 2020-08-19 NOTE — ED Notes (Signed)
Patient discharged home to caregiver.  All discharge instructions reviewed.  Patient able to verbalize understanding via teachback method.  Wheelchair out of ED.

## 2020-08-21 LAB — URINE CULTURE: Culture: >=100000 — AB

## 2020-08-26 LAB — CULTURE, BLOOD (SINGLE)
Culture: NO GROWTH
Special Requests: ADEQUATE

## 2020-11-11 ENCOUNTER — Other Ambulatory Visit (HOSPITAL_COMMUNITY): Payer: Self-pay | Admitting: Otolaryngology

## 2020-11-11 DIAGNOSIS — R221 Localized swelling, mass and lump, neck: Secondary | ICD-10-CM

## 2020-11-15 ENCOUNTER — Encounter (HOSPITAL_COMMUNITY): Payer: Self-pay

## 2020-11-15 NOTE — Progress Notes (Unsigned)
   Alexa Johnson "Kat" Female, 60 y.o., 06-19-1961  MRN:  [id number] Phone:  [phone] Jerilynn Mages)       PCP:  [facility] [place] [facility] Cvg:  None            RE: Biopsy Received: Today  Message Details  Arne Cleveland, MD  Lenore Cordia Ok   [place] core L submandib LAN  R/o met v lymphoma   DDH    Previous Messages  ----- Message -----  From: Lenore Cordia  Sent: 11/15/2020 12:10 PM EST  To: Ir Procedure Requests  Subject: Biopsy                      Procedure Requested: [place] FNA    Reason for Procedure: Left neck mass    Provider Requesting: Ebbie Latus A  Provider Telephone: [phone]    Other Info: Office biopsy with scant purulence, no epithelium indentified on FNA

## 2020-11-24 ENCOUNTER — Other Ambulatory Visit: Payer: Self-pay | Admitting: Student

## 2020-11-25 ENCOUNTER — Other Ambulatory Visit: Payer: Self-pay

## 2020-11-25 ENCOUNTER — Encounter (HOSPITAL_COMMUNITY): Payer: Self-pay

## 2020-11-25 ENCOUNTER — Ambulatory Visit (HOSPITAL_COMMUNITY)
Admission: RE | Admit: 2020-11-25 | Discharge: 2020-11-25 | Disposition: A | Discharge status: Home / Self Care | Payer: Self-pay | Source: Ambulatory Visit | Attending: Otolaryngology | Admitting: Otolaryngology

## 2020-11-25 DIAGNOSIS — D367 Benign neoplasm of other specified sites: Secondary | ICD-10-CM | POA: Insufficient documentation

## 2020-11-25 DIAGNOSIS — Z885 Allergy status to narcotic agent status: Secondary | ICD-10-CM | POA: Insufficient documentation

## 2020-11-25 DIAGNOSIS — Z79899 Other long term (current) drug therapy: Secondary | ICD-10-CM | POA: Insufficient documentation

## 2020-11-25 DIAGNOSIS — F17210 Nicotine dependence, cigarettes, uncomplicated: Secondary | ICD-10-CM | POA: Insufficient documentation

## 2020-11-25 DIAGNOSIS — Z882 Allergy status to sulfonamides status: Secondary | ICD-10-CM | POA: Insufficient documentation

## 2020-11-25 DIAGNOSIS — R221 Localized swelling, mass and lump, neck: Secondary | ICD-10-CM

## 2020-11-25 MED ORDER — MIDAZOLAM HCL 2 MG/2ML IJ SOLN
INTRAMUSCULAR | Status: AC
  Filled 2020-11-25: qty 2

## 2020-11-25 MED ORDER — SODIUM CHLORIDE 0.9 % IV SOLN: INTRAVENOUS | Status: DC

## 2020-11-25 MED ORDER — LIDOCAINE HCL (PF) 1 % IJ SOLN
INTRAMUSCULAR | Status: AC
  Filled 2020-11-25: qty 30

## 2020-11-25 MED ORDER — FENTANYL CITRATE (PF) 100 MCG/2ML IJ SOLN
INTRAMUSCULAR | Status: AC
  Filled 2020-11-25: qty 2

## 2020-11-25 NOTE — H&P (Signed)
Chief Complaint: Left neck mass  Referring Physician(s): Skotnicki, Meghan  Supervising Physician: Markus Daft  Patient Status: [facility] - Out-pt  History of Present Illness: Alexa Johnson is a 60 y.o. female who was seen by Dr. Roseanna Rainbow on November 04, 2020 for evaluation of a neck mass.  She had undergone ultrasound on May 20, 2020 which showed a 1.9 cm hypoechoic mass on the left which is suspicious for pathologically enlarged submandibular lymph node.  She underwent FNA biopsy however this was undiagnosed.  She is here today for image guided core biopsy of the left submandibular lymph node.  She is n.p.o. No nausea/vomiting. No Fever/chills. ROS negative.   Past Medical History:  Diagnosis Date  . Migraines   . Pneumonia   . Sinusitis     Past Surgical History:  Procedure Laterality Date  . CESAREAN SECTION      Allergies: Hydrocodone and Sulfa antibiotics  Medications: Prior to Admission medications   Medication Sig Start Date End Date Taking? Authorizing Provider  aspirin-acetaminophen-caffeine (EXCEDRIN MIGRAINE) [id number] MG tablet Take 2 tablets by mouth every 6 (six) hours as needed for headache.   Yes [provider]  calcium carbonate (TUMS - DOSED IN MG ELEMENTAL CALCIUM) 500 MG chewable tablet Chew 1,000-1,500 mg by mouth daily as needed for indigestion or heartburn.   Yes [provider]  Carboxymethylcellulose Sodium (THERATEARS OP) Place 1 drop into both eyes daily as needed (dry eyes).   Yes [provider]  famotidine (PEPCID) 10 MG tablet Take 20 mg by mouth daily as needed for heartburn or indigestion.   Yes [provider]  fexofenadine (ALLEGRA) 180 MG tablet Take 180 mg by mouth daily.   Yes [provider]  fluticasone (FLONASE) 50 MCG/ACT nasal spray Place 2 sprays into both nostrils daily. Patient taking differently: Place 2 sprays into both nostrils daily as needed for allergies. 10/11/16   Yes Triplett, Tammy, PA-C  gabapentin (NEURONTIN) 300 MG capsule Take 300 mg by mouth 2 (two) times daily as needed (pain). 05/06/20  Yes [provider]  ibuprofen (ADVIL) 600 MG tablet Take 600 mg by mouth every 8 (eight) hours as needed for moderate pain.   Yes [provider]  Ketotifen Fumarate (ALAWAY OP) Place 1 drop into both eyes daily as needed (allergies).   Yes [provider]  Phenylephrine-Acetaminophen (TYLENOL SINUS+HEADACHE PO) Take 2 tablets by mouth daily as needed (sinus headaches).   Yes [provider]  albuterol (VENTOLIN HFA) 108 (90 Base) MCG/ACT inhaler Inhale 1-2 puffs into the lungs every 6 (six) hours as needed for wheezing or shortness of breath.    [provider]  cephALEXin (KEFLEX) 500 MG capsule Take 1 capsule (500 mg total) by mouth 2 (two) times daily. Patient not taking: No sig reported 08/19/20   Orpah Greek, MD  FIBER PO Take 5 capsules by mouth daily.    [provider]  Multiple Vitamin (MULTIVITAMIN) tablet Take 1 tablet by mouth daily.    [provider]  Omega-3 Fatty Acids (OMEGA 3 PO) Take 1,200 mg by mouth daily.    [provider]     Family History  Problem Relation Age of Onset  . Diabetes Mother   . Hypertension Mother   . Diabetes Father   . Cancer Father   . Heart failure Other   . Cancer Other     Social History   Socioeconomic History  . Marital status: Legally Separated  Spouse name: Not on file  . Number of children: Not on file  . Years of education: Not on file  . Highest education level: Not on file  Occupational History  . Not on file  Tobacco Use  . Smoking status: Current Every Day Smoker    Packs/day: 0.50    Types: Cigarettes  . Smokeless tobacco: Never Used  Vaping Use  . Vaping Use: Never used  Substance and Sexual Activity  . Alcohol use: Yes    Comment: rarely  . Drug use: No  . Sexual activity: Never  Other Topics  Concern  . Not on file  Social History Narrative  . Not on file   Social Determinants of Health   Financial Resource Strain: Not on file  Food Insecurity: Not on file  Transportation Needs: Not on file  Physical Activity: Not on file  Stress: Not on file  Social Connections: Not on file     Review of Systems: A 12 point ROS discussed and pertinent positives are indicated in the HPI above.  All other systems are negative.  Review of Systems  Vital Signs: BP 128/82   Pulse 68   Temp 97.7 F (36.5 C) (Oral)   Ht 5\' 6"  (1.676 m)   Wt 86.2 kg   SpO2 98%   BMI 30.67 kg/m   Physical Exam Vitals reviewed.  Constitutional:      Appearance: Normal appearance.  HENT:     Head: Normocephalic and atraumatic.  Eyes:     Extraocular Movements: Extraocular movements intact.  Neck:     Comments: Palpable submandibular lymph node on the left. Cardiovascular:     Rate and Rhythm: Normal rate and regular rhythm.  Pulmonary:     Effort: Pulmonary effort is normal. No respiratory distress.     Breath sounds: Normal breath sounds.  Abdominal:     General: There is no distension.     Palpations: Abdomen is soft.     Tenderness: There is no abdominal tenderness.  Musculoskeletal:        General: Normal range of motion.  Skin:    General: Skin is warm and dry.  Neurological:     General: No focal deficit present.     Mental Status: She is alert and oriented to person, place, and time.  Psychiatric:        Mood and Affect: Mood normal.        Behavior: Behavior normal.        Thought Content: Thought content normal.        Judgment: Judgment normal.     Imaging: No results found.  Labs:  CBC: Recent Labs    08/18/20 2342  WBC 16.9*  HGB 13.6  HCT 40.6  PLT 276    COAGS: Recent Labs    08/18/20 2342  INR 1.0  APTT 26    BMP: Recent Labs    08/18/20 2342  NA 136  K 3.9  CL 103  CO2 25  GLUCOSE 129*  BUN 14  CALCIUM 9.1  CREATININE 0.74  GFRNONAA  >60    LIVER FUNCTION TESTS: Recent Labs    08/18/20 2342  BILITOT 0.9  AST 25  ALT 28  ALKPHOS 52  PROT 7.0  ALBUMIN 4.0    TUMOR MARKERS: No results for input(s): AFPTM, CEA, CA199, CHROMGRNA in the last 8760 hours.  Assessment and Plan:  1.9 cm suspicious left submandibular lymph node.  Nondiagnostic on previous FNA.  We will proceed  with image guided core biopsy today by Dr. Anselm Pancoast.  Risks and benefits of image guided biopsy of left submandibular lymph node was discussed with the patient and/or patient's family including, but not limited to bleeding, infection, damage to adjacent structures or low yield requiring additional tests.  All of the questions were answered and there is agreement to proceed.  Consent signed and in chart.  Thank you for this interesting consult.  I greatly enjoyed meeting Alexa Johnson and look forward to participating in their care.  A copy of this report was sent to the requesting provider on this date.  Electronically Signed: Murrell Redden, PA-C   11/25/2020, 12:56 PM      I spent a total of  15 Minutes  in face to face in clinical consultation, greater than 50% of which was counseling/coordinating care for lymph node biopsy.

## 2020-11-25 NOTE — Discharge Instructions (Addendum)
Needle Biopsy, Care After °These instructions tell you how to care for yourself after your procedure. Your doctor may also give you more specific instructions. Call your doctor if you have any problems or questions. °What can I expect after the procedure? °After the procedure, it is common to have: °· Soreness. °· Bruising. °· Mild pain. °Follow these instructions at home: °· Return to your normal activities as told by your doctor. Ask your doctor what activities are safe for you. °· Take over-the-counter and prescription medicines only as told by your doctor. °· Wash your hands with soap and water before you change your bandage (dressing). If you cannot use soap and water, use hand sanitizer. °· Follow instructions from your doctor about: °? How to take care of your puncture site. °? When and how to change your bandage. °? When to remove your bandage. °· Check your puncture site every day for signs of infection. Watch for: °? Redness, swelling, or pain. °? Fluid or blood. °? Pus or a bad smell. °? Warmth. °· Do not take baths, swim, or use a hot tub until your doctor approves. Ask your doctor if you may take showers. You may only be allowed to take sponge baths. °· Keep all follow-up visits as told by your doctor. This is important.   °Contact a doctor if you have: °· A fever. °· Redness, swelling, or pain at the puncture site, and it lasts longer than a few days. °· Fluid, blood, or pus coming from the puncture site. °· Warmth coming from the puncture site. °Get help right away if: °· You have a lot of bleeding from the puncture site. °Summary °· After the procedure, it is common to have soreness, bruising, or mild pain at the puncture site. °· Check your puncture site every day for signs of infection, such as redness, swelling, or pain. °· Get help right away if you have severe bleeding from your puncture site. °This information is not intended to replace advice given to you by your health care provider. Make  sure you discuss any questions you have with your health care provider. °Document Revised: 02/26/2020 Document Reviewed: 02/26/2020 °Elsevier Patient Education © 2021 [employer] °Moderate Conscious Sedation, Adult °Sedation is the use of medicines to promote relaxation and to relieve discomfort and anxiety. Moderate conscious sedation is a type of sedation. Under moderate conscious sedation, you are less alert than normal, but you are still able to respond to instructions, touch, or both. °Moderate conscious sedation is used during short medical and dental procedures. It is milder than deep sedation, which is a type of sedation under which you cannot be easily woken up. It is also milder than general anesthesia, which is the use of medicines to make you unconscious. Moderate conscious sedation allows you to return to your regular activities sooner. °Tell a health care provider about: °· Any allergies you have. °· All medicines you are taking, including vitamins, herbs, eye drops, creams, and over-the-counter medicines. °· Any use of steroids. This includes steroids taken by mouth or as a cream. °· Any problems you or family members have had with sedatives and anesthetic medicines. °· Any blood disorders you have. °· Any surgeries you have had. °· Any medical conditions you have, such as sleep apnea. °· Whether you are pregnant or may be pregnant. °· Any use of cigarettes, alcohol, marijuana, or drugs. °What are the risks? °Generally, this is a safe procedure. However, problems may occur, including: °· Getting too much   medicine (oversedation). °· Nausea. °· Allergic reaction to medicines. °· Trouble breathing. If this happens, a breathing tube may be used. It will be removed when you are awake and breathing on your own. °· Heart trouble. °· Lung trouble. °· Confusion that gets better with time (emergence delirium). °What happens before the procedure? °Staying hydrated °Follow instructions from your health care  provider about hydration, which may include: °· Up to 2 hours before the procedure - you may continue to drink clear liquids, such as water, clear fruit juice, black coffee, and plain tea. °Eating and drinking restrictions °Follow instructions from your health care provider about eating and drinking, which may include: °· 8 hours before the procedure - stop eating heavy meals or foods, such as meat, fried foods, or fatty foods. °· 6 hours before the procedure - stop eating light meals or foods, such as toast or cereal. °· 6 hours before the procedure - stop drinking milk or drinks that contain milk. °· 2 hours before the procedure - stop drinking clear liquids. °Medicines °Ask your health care provider about: °· Changing or stopping your regular medicines. This is especially important if you are taking diabetes medicines or blood thinners. °· Taking medicines such as aspirin and ibuprofen. These medicines can thin your blood. Do not take these medicines unless your health care provider tells you to take them. °· Taking over-the-counter medicines, vitamins, herbs, and supplements. °Tests and exams °· You will have a physical exam. °· You may have blood tests done to show how well: °? Your kidneys and liver work. °? Your blood clots. °General instructions °· Plan to have a responsible adult take you home from the hospital or clinic. °· If you will be going home right after the procedure, plan to have a responsible adult care for you for the time you are told. This is important. °What happens during the procedure? °· You will be given the sedative. The sedative may be given: °? As a pill that you will swallow. It can also be inserted into the rectum. °? As a spray through the nose. °? As an injection into the muscle. °? As an injection into the vein through an IV. °· You may be given oxygen as needed. °· Your breathing, heart rate, and blood pressure will be monitored during the procedure. °· The medical or dental  procedure will be done. °The procedure may vary among health care providers and hospitals.   °What happens after the procedure? °· Your blood pressure, heart rate, breathing rate, and blood oxygen level will be monitored until you leave the hospital or clinic. °· You will get fluids through your IV if needed. °· Do not drive or operate machinery until your health care provider says that it is safe. °Summary °· Sedation is the use of medicines to promote relaxation and to relieve discomfort and anxiety. Moderate conscious sedation is a type of sedation that is used during short medical and dental procedures. °· Tell the health care provider about any medical conditions that you have and about all the medicines that you are taking. °· You will be given the sedative as a pill, a spray through the nose, an injection into the muscle, or an injection into the vein through an IV. Vital signs are monitored during the sedation. °· Moderate conscious sedation allows you to return to your regular activities sooner. °This information is not intended to replace advice given to you by your health care provider. Make sure you discuss   any questions you have with your health care provider. °Document Revised: 12/26/2019 Document Reviewed: 07/24/2019 °Elsevier Patient Education © 2021 Elsevier Inc. ° °  ° °

## 2020-11-25 NOTE — Progress Notes (Signed)
Left lymph node biopsy completed by Dr. Anselm Pancoast. No sedation or pain medicine given per patient request for biopsy procedure. Patient denies any pain or discomfort post procedure. Band-aid and Ice pack applied to left neck biopsy area. IV removed and patient discharged home per order.

## 2020-11-26 NOTE — Procedures (Signed)
Interventional Radiology Procedure:   Indications: Left neck nodule  Procedure: US guided FNA and core biopsy  Findings: Hypoechoic nodule adjacent to left parotid gland.  Aspirated a small amount of purulent / necrotic material.  Also took core biopsies of the nodule after aspiration.  Complications: None     EBL: less than 10 ml  Plan: Send aspirate for culture and core samples to pathology.   Vian Fluegel R. Anselm Pancoast, MD  Pager: [phone]

## 2020-11-29 LAB — SURGICAL PATHOLOGY

## 2020-11-30 LAB — AEROBIC/ANAEROBIC CULTURE W GRAM STAIN (SURGICAL/DEEP WOUND)
Culture: NO GROWTH
Gram Stain: NONE SEEN

## 2021-06-02 ENCOUNTER — Encounter (HOSPITAL_COMMUNITY): Payer: Self-pay

## 2021-06-02 ENCOUNTER — Other Ambulatory Visit: Payer: Self-pay

## 2021-06-02 DIAGNOSIS — F17210 Nicotine dependence, cigarettes, uncomplicated: Secondary | ICD-10-CM | POA: Insufficient documentation

## 2021-06-02 DIAGNOSIS — M545 Low back pain, unspecified: Secondary | ICD-10-CM | POA: Insufficient documentation

## 2021-06-02 NOTE — ED Triage Notes (Signed)
Pt reports back pain (lower left ) that started this evening when she got up to go to work and was sent home. Pt reports this pain started initially years ago, has had sciatica before in this area.

## 2021-06-03 ENCOUNTER — Emergency Department (HOSPITAL_COMMUNITY)
Admission: EM | Admit: 2021-06-03 | Discharge: 2021-06-03 | Disposition: A | Discharge status: Home / Self Care | Payer: Self-pay | Attending: Emergency Medicine | Admitting: Emergency Medicine

## 2021-06-03 DIAGNOSIS — M545 Low back pain, unspecified: Secondary | ICD-10-CM

## 2021-06-03 MED ORDER — KETOROLAC TROMETHAMINE 60 MG/2ML IM SOLN
60.0000 mg | Freq: Once | INTRAMUSCULAR | Status: AC
  Administered 2021-06-03: 60 mg via INTRAMUSCULAR
  Filled 2021-06-03: qty 2

## 2021-06-03 MED ORDER — OXYCODONE-ACETAMINOPHEN 5-325 MG PO TABS: 1.0000 | ORAL_TABLET | Freq: Four times a day (QID) | ORAL | 0 refills | Status: AC | PRN

## 2021-06-03 MED ORDER — NAPROXEN 500 MG PO TABS: 500.0000 mg | ORAL_TABLET | Freq: Two times a day (BID) | ORAL | 0 refills | Status: AC

## 2021-06-03 MED ORDER — OXYCODONE-ACETAMINOPHEN 5-325 MG PO TABS
2.0000 | ORAL_TABLET | Freq: Once | ORAL | Status: AC
Start: 2021-06-03 — End: 2021-06-03
  Administered 2021-06-03: 2 via ORAL
  Filled 2021-06-03: qty 2

## 2021-06-03 NOTE — Discharge Instructions (Signed)
Begin taking naproxen as prescribed.  Begin taking Percocet as prescribed for pain not relieved with naproxen.  Follow-up with primary doctor if symptoms are not improving in the next week.

## 2021-06-03 NOTE — ED Provider Notes (Signed)
[facility] EMERGENCY DEPARTMENT Provider Note   CSN: [id number] Arrival date & time: 06/02/21  2225     History Chief Complaint  Patient presents with   Back Pain    Alexa Johnson is a 60 y.o. female.  Patient is a 60 year old female with past medical history of migraines.  She presents today for evaluation of back pain.  Patient describes pain to her left lower lumbar region for the past 2 days.  This became worse this evening when she attempted to go to work.  She describes constant pain with no radiation to the leg, leg weakness, or bowel or bladder complaints.  The history is provided by the patient.  Back Pain Location:  Lumbar spine Quality:  Aching Radiates to:  Does not radiate Pain severity:  Moderate Onset quality:  Sudden Duration:  2 days Timing:  Constant Progression:  Worsening     Past Medical History:  Diagnosis Date   Migraines    Pneumonia    Sinusitis     There are no problems to display for this patient.   Past Surgical History:  Procedure Laterality Date   CESAREAN SECTION       OB History     Gravida  3   Para  3   Term  3   Preterm      AB      Living         SAB      IAB      Ectopic      Multiple      Live Births              Family History  Problem Relation Age of Onset   Diabetes Mother    Hypertension Mother    Diabetes Father    Cancer Father    Heart failure Other    Cancer Other     Social History   Tobacco Use   Smoking status: Every Day    Packs/day: 0.50    Types: Cigarettes   Smokeless tobacco: Never  Vaping Use   Vaping Use: Never used  Substance Use Topics   Alcohol use: Yes    Comment: rarely   Drug use: No    Home Medications Prior to Admission medications   Medication Sig Start Date End Date Taking? Authorizing Provider  albuterol (VENTOLIN HFA) 108 (90 Base) MCG/ACT inhaler Inhale 1-2 puffs into the lungs every 6 (six) hours as needed for wheezing or shortness of breath.     [provider]  aspirin-acetaminophen-caffeine (EXCEDRIN MIGRAINE) [phone] MG tablet Take 2 tablets by mouth every 6 (six) hours as needed for headache.    [provider]  calcium carbonate (TUMS - DOSED IN MG ELEMENTAL CALCIUM) 500 MG chewable tablet Chew 1,000-1,500 mg by mouth daily as needed for indigestion or heartburn.    [provider]  Carboxymethylcellulose Sodium (THERATEARS OP) Place 1 drop into both eyes daily as needed (dry eyes).    [provider]  cephALEXin (KEFLEX) 500 MG capsule Take 1 capsule (500 mg total) by mouth 2 (two) times daily. Patient not taking: No sig reported 08/19/20   Orpah Greek, MD  famotidine (PEPCID) 10 MG tablet Take 20 mg by mouth daily as needed for heartburn or indigestion.    [provider]  fexofenadine (ALLEGRA) 180 MG tablet Take 180 mg by mouth daily.    [provider]  FIBER PO Take 5 capsules by mouth daily.  [provider]  fluticasone (FLONASE) 50 MCG/ACT nasal spray Place 2 sprays into both nostrils daily. Patient taking differently: Place 2 sprays into both nostrils daily as needed for allergies. 10/11/16   Triplett, Tammy, PA-C  gabapentin (NEURONTIN) 300 MG capsule Take 300 mg by mouth 2 (two) times daily as needed (pain). 05/06/20   [provider]  ibuprofen (ADVIL) 600 MG tablet Take 600 mg by mouth every 8 (eight) hours as needed for moderate pain.    [provider]  Ketotifen Fumarate (ALAWAY OP) Place 1 drop into both eyes daily as needed (allergies).    [provider]  Multiple Vitamin (MULTIVITAMIN) tablet Take 1 tablet by mouth daily.    [provider]  Omega-3 Fatty Acids (OMEGA 3 PO) Take 1,200 mg by mouth daily.    [provider]  Phenylephrine-Acetaminophen (TYLENOL SINUS+HEADACHE PO) Take 2 tablets by mouth daily as needed (sinus headaches).    [provider]    Allergies    Hydrocodone  and Sulfa antibiotics  Review of Systems   Review of Systems  Musculoskeletal:  Positive for back pain.  All other systems reviewed and are negative.  Physical Exam Updated Vital Signs BP 115/71   Pulse 74   Temp 97.9 F (36.6 C) (Oral)   Resp 17   Ht 5\' 6"  (1.676 m)   Wt 86.2 kg   SpO2 97%   BMI 30.67 kg/m   Physical Exam Vitals and nursing note reviewed.  Constitutional:      General: She is not in acute distress.    Appearance: Normal appearance. She is not ill-appearing.  HENT:     Head: Normocephalic and atraumatic.  Pulmonary:     Effort: Pulmonary effort is normal.  Musculoskeletal:     Comments: There is tenderness to palpation in the soft tissues of the left lower lumbar region.  There is no bony tenderness or step-off.  Skin:    General: Skin is warm and dry.  Neurological:     Mental Status: She is alert and oriented to person, place, and time.     Comments: DTRs are trace and symmetrical in the patellar and Achilles tendons bilaterally.  Strength is 5 out of 5 in both lower extremities.  Patient is able to ambulate without difficulty.    ED Results / Procedures / Treatments   Labs (all labs ordered are listed, but only abnormal results are displayed) Labs Reviewed - No data to display  EKG None  Radiology No results found.  Procedures Procedures   Medications Ordered in ED Medications  ketorolac (TORADOL) injection 60 mg (has no administration in time range)  oxyCODONE-acetaminophen (PERCOCET/ROXICET) 5-325 MG per tablet 2 tablet (has no administration in time range)    ED Course  I have reviewed the triage vital signs and the nursing notes.  Pertinent labs & imaging results that were available during my care of the patient were reviewed by me and considered in my medical decision making (see chart for details).    MDM Rules/Calculators/A&P  Patient presenting here with complaints of low back pain as described in the HPI.  Symptoms most  consistent with a musculoskeletal etiology.  Pain is worse when she bends, moves, and stands and is reproducible with palpation in the left lower lumbar region.  There are no red flags that would suggest an emergent situation and I do not feel as though emergent imaging or intervention is necessary.  Patient will be given Toradol and Percocet.  She will be discharged with naproxen and Percocet and follow-up if not improving.  Work excuse for 2 days given.  Final Clinical Impression(s) / ED Diagnoses Final diagnoses:  None    Rx / DC Orders ED Discharge Orders     None        Veryl Speak, MD 06/03/21 [phone]

## 2021-10-06 ENCOUNTER — Emergency Department (HOSPITAL_COMMUNITY)
Admission: EM | Admit: 2021-10-06 | Discharge: 2021-10-06 | Disposition: A | Discharge status: Home / Self Care | Payer: Self-pay | Attending: Emergency Medicine | Admitting: Emergency Medicine

## 2021-10-06 ENCOUNTER — Encounter (HOSPITAL_COMMUNITY): Payer: Self-pay | Admitting: *Deleted

## 2021-10-06 DIAGNOSIS — R197 Diarrhea, unspecified: Secondary | ICD-10-CM | POA: Insufficient documentation

## 2021-10-06 DIAGNOSIS — R110 Nausea: Secondary | ICD-10-CM | POA: Insufficient documentation

## 2021-10-06 DIAGNOSIS — R1030 Lower abdominal pain, unspecified: Secondary | ICD-10-CM | POA: Insufficient documentation

## 2021-10-06 DIAGNOSIS — Z20822 Contact with and (suspected) exposure to covid-19: Secondary | ICD-10-CM | POA: Insufficient documentation

## 2021-10-06 LAB — CBC WITH DIFFERENTIAL/PLATELET
Abs Immature Granulocytes: 0.02 10*3/uL (ref 0.00–0.07)
Basophils Absolute: 0.1 10*3/uL (ref 0.0–0.1)
Basophils Relative: 1 %
Eosinophils Absolute: 0.5 10*3/uL (ref 0.0–0.5)
Eosinophils Relative: 5 %
HCT: 41.7 % (ref 36.0–46.0)
Hemoglobin: 14.3 g/dL (ref 12.0–15.0)
Immature Granulocytes: 0 %
Lymphocytes Relative: 47 %
Lymphs Abs: 4.2 10*3/uL — ABNORMAL HIGH (ref 0.7–4.0)
MCH: 33.3 pg (ref 26.0–34.0)
MCHC: 34.3 g/dL (ref 30.0–36.0)
MCV: 97 fL (ref 80.0–100.0)
Monocytes Absolute: 0.6 10*3/uL (ref 0.1–1.0)
Monocytes Relative: 7 %
Neutro Abs: 3.5 10*3/uL (ref 1.7–7.7)
Neutrophils Relative %: 40 %
Platelets: 242 10*3/uL (ref 150–400)
RBC: 4.3 MIL/uL (ref 3.87–5.11)
RDW: 12.6 % (ref 11.5–15.5)
WBC: 8.8 10*3/uL (ref 4.0–10.5)
nRBC: 0 % (ref 0.0–0.2)

## 2021-10-06 LAB — COMPREHENSIVE METABOLIC PANEL
ALT: 19 U/L (ref 0–44)
AST: 20 U/L (ref 15–41)
Albumin: 4 g/dL (ref 3.5–5.0)
Alkaline Phosphatase: 51 U/L (ref 38–126)
Anion gap: 6 (ref 5–15)
BUN: 14 mg/dL (ref 6–20)
CO2: 25 mmol/L (ref 22–32)
Calcium: 9.1 mg/dL (ref 8.9–10.3)
Chloride: 106 mmol/L (ref 98–111)
Creatinine, Ser: 1.46 mg/dL — ABNORMAL HIGH (ref 0.44–1.00)
GFR, Estimated: 41 mL/min — ABNORMAL LOW (ref >60)
Glucose, Bld: 104 mg/dL — ABNORMAL HIGH (ref 70–99)
Potassium: 3.6 mmol/L (ref 3.5–5.1)
Sodium: 137 mmol/L (ref 135–145)
Total Bilirubin: 0.5 mg/dL (ref 0.3–1.2)
Total Protein: 6.9 g/dL (ref 6.5–8.1)

## 2021-10-06 LAB — URINALYSIS, ROUTINE W REFLEX MICROSCOPIC
Bilirubin Urine: NEGATIVE
Glucose, UA: NEGATIVE mg/dL
Hgb urine dipstick: NEGATIVE
Ketones, ur: 5 mg/dL — AB
Leukocytes,Ua: NEGATIVE
Nitrite: NEGATIVE
Protein, ur: NEGATIVE mg/dL
Specific Gravity, Urine: 1.019 (ref 1.005–1.030)
pH: 6 (ref 5.0–8.0)

## 2021-10-06 LAB — LIPASE, BLOOD: Lipase: 33 U/L (ref 11–51)

## 2021-10-06 LAB — RESP PANEL BY RT-PCR (FLU A&B, COVID) ARPGX2
Influenza A by PCR: NEGATIVE
Influenza B by PCR: NEGATIVE
SARS Coronavirus 2 by RT PCR: NEGATIVE

## 2021-10-06 MED ORDER — SODIUM CHLORIDE 0.9 % IV BOLUS
1000.0000 mL | Freq: Once | INTRAVENOUS | Status: AC
  Administered 2021-10-06: 1000 mL via INTRAVENOUS

## 2021-10-06 MED ORDER — DICYCLOMINE HCL 10 MG/ML IM SOLN
20.0000 mg | Freq: Once | INTRAMUSCULAR | Status: AC
  Administered 2021-10-06: 20 mg via INTRAMUSCULAR
  Filled 2021-10-06: qty 2

## 2021-10-06 MED ORDER — ONDANSETRON HCL 4 MG/2ML IJ SOLN
4.0000 mg | Freq: Once | INTRAMUSCULAR | Status: AC
  Administered 2021-10-06: 4 mg via INTRAVENOUS
  Filled 2021-10-06: qty 2

## 2021-10-06 MED ORDER — ONDANSETRON 4 MG PO TBDP: 4.0000 mg | ORAL_TABLET | Freq: Three times a day (TID) | ORAL | 0 refills | Status: AC | PRN

## 2021-10-06 MED ORDER — DICYCLOMINE HCL 20 MG PO TABS: 20.0000 mg | ORAL_TABLET | Freq: Two times a day (BID) | ORAL | 0 refills | Status: AC

## 2021-10-06 NOTE — ED Provider Notes (Signed)
[facility] EMERGENCY DEPARTMENT Provider Note   CSN: [id number] Arrival date & time: 10/06/21  1322     History  Chief Complaint  Patient presents with   Abdominal Pain    Alexa Johnson is a 61 y.o. female with no significant past medical history who presents to the ED due to lower abdominal pain associated with nausea and diarrhea x1 day.  Patient describes abdominal pain as a "cramping sensation".  Denies vaginal and urinary symptoms.  Denies emesis.  No fever or chills.  Denies sick contacts or COVID exposures.  Denies melena, hematochezia, and hematemesis.  She has tried Pepto-Bismol with no relief of her symptoms.  Denies back pain.     Home Medications Prior to Admission medications   Medication Sig Start Date End Date Taking? Authorizing Provider  dicyclomine (BENTYL) 20 MG tablet Take 1 tablet (20 mg total) by mouth 2 (two) times daily. 10/06/21  Yes Vedant Shehadeh C, PA-C  ondansetron (ZOFRAN-ODT) 4 MG disintegrating tablet Take 1 tablet (4 mg total) by mouth every 8 (eight) hours as needed for nausea or vomiting. 10/06/21  Yes Idalie Canto, Druscilla Brownie, PA-C  albuterol (VENTOLIN HFA) 108 (90 Base) MCG/ACT inhaler Inhale 1-2 puffs into the lungs every 6 (six) hours as needed for wheezing or shortness of breath.    [provider]  aspirin-acetaminophen-caffeine (EXCEDRIN MIGRAINE) [phone] MG tablet Take 2 tablets by mouth every 6 (six) hours as needed for headache.    [provider]  calcium carbonate (TUMS - DOSED IN MG ELEMENTAL CALCIUM) 500 MG chewable tablet Chew 1,000-1,500 mg by mouth daily as needed for indigestion or heartburn.    [provider]  Carboxymethylcellulose Sodium (THERATEARS OP) Place 1 drop into both eyes daily as needed (dry eyes).    [provider]  cephALEXin (KEFLEX) 500 MG capsule Take 1 capsule (500 mg total) by mouth 2 (two) times daily. Patient not taking: No sig reported 08/19/20   Orpah Greek, MD   famotidine (PEPCID) 10 MG tablet Take 20 mg by mouth daily as needed for heartburn or indigestion.    [provider]  fexofenadine (ALLEGRA) 180 MG tablet Take 180 mg by mouth daily.    [provider]  FIBER PO Take 5 capsules by mouth daily.    [provider]  fluticasone (FLONASE) 50 MCG/ACT nasal spray Place 2 sprays into both nostrils daily. Patient taking differently: Place 2 sprays into both nostrils daily as needed for allergies. 10/11/16   Triplett, Tammy, PA-C  gabapentin (NEURONTIN) 300 MG capsule Take 300 mg by mouth 2 (two) times daily as needed (pain). 05/06/20   [provider]  ibuprofen (ADVIL) 600 MG tablet Take 600 mg by mouth every 8 (eight) hours as needed for moderate pain.    [provider]  Ketotifen Fumarate (ALAWAY OP) Place 1 drop into both eyes daily as needed (allergies).    [provider]  Multiple Vitamin (MULTIVITAMIN) tablet Take 1 tablet by mouth daily.    [provider]  naproxen (NAPROSYN) 500 MG tablet Take 1 tablet (500 mg total) by mouth 2 (two) times daily. 06/03/21   Veryl Speak, MD  Omega-3 Fatty Acids (OMEGA 3 PO) Take 1,200 mg by mouth daily.    [provider]  oxyCODONE-acetaminophen (PERCOCET) 5-325 MG tablet Take 1-2 tablets by mouth every 6 (six) hours as needed. 06/03/21   Veryl Speak, MD  Phenylephrine-Acetaminophen (TYLENOL SINUS+HEADACHE PO) Take 2 tablets by mouth daily as needed (sinus headaches).  [provider]      Allergies    Hydrocodone and Sulfa antibiotics    Review of Systems   Review of Systems  Constitutional:  Negative for chills and fever.  Gastrointestinal:  Positive for abdominal pain, diarrhea and nausea. Negative for vomiting.  Genitourinary:  Negative for dysuria and vaginal discharge.   Physical Exam Updated Vital Signs BP 115/76    Pulse 77    Temp 98.1 F (36.7 C) (Oral)    Resp 19    Wt 84.9 kg    SpO2 95%    BMI 30.21 kg/m   Physical Exam Vitals and nursing note reviewed.  Constitutional:      General: She is not in acute distress.    Appearance: She is not ill-appearing.  HENT:     Head: Normocephalic.  Eyes:     Pupils: Pupils are equal, round, and reactive to light.  Cardiovascular:     Rate and Rhythm: Normal rate and regular rhythm.     Pulses: Normal pulses.     Heart sounds: Normal heart sounds. No murmur heard.   No friction rub. No gallop.  Pulmonary:     Effort: Pulmonary effort is normal.     Breath sounds: Normal breath sounds.  Abdominal:     General: Abdomen is flat. There is no distension.     Palpations: Abdomen is soft.     Tenderness: There is abdominal tenderness. There is no guarding or rebound.     Comments: Mild lower abdominal tenderness without rebound or guarding  Musculoskeletal:        General: Normal range of motion.     Cervical back: Neck supple.  Skin:    General: Skin is warm and dry.  Neurological:     General: No focal deficit present.     Mental Status: She is alert.  Psychiatric:        Mood and Affect: Mood normal.        Behavior: Behavior normal.    ED Results / Procedures / Treatments   Labs (all labs ordered are listed, but only abnormal results are displayed) Labs Reviewed  COMPREHENSIVE METABOLIC PANEL - Abnormal; Notable for the following components:      Result Value   Glucose, Bld 104 (*)    Creatinine, Ser 1.46 (*)    GFR, Estimated 41 (*)    All other components within normal limits  CBC WITH DIFFERENTIAL/PLATELET - Abnormal; Notable for the following components:   Lymphs Abs 4.2 (*)    All other components within normal limits  URINALYSIS, ROUTINE W REFLEX MICROSCOPIC - Abnormal; Notable for the following components:   Ketones, ur 5 (*)    All other components within normal limits  RESP PANEL BY RT-PCR (FLU A&B, COVID) ARPGX2  LIPASE, BLOOD    EKG None  Radiology No results found.  Procedures Procedures    Medications  Ordered in ED Medications  dicyclomine (BENTYL) injection 20 mg (has no administration in time range)  sodium chloride 0.9 % bolus 1,000 mL (1,000 mLs Intravenous New Bag/Given 10/06/21 1445)  ondansetron (ZOFRAN) injection 4 mg (4 mg Intravenous Given 10/06/21 1445)    ED Course/ Medical Decision Making/ A&P Clinical Course as of 10/06/21 1712  Thu Oct 06, 2021  1421 Creatinine(!): 1.46 [CA]    Clinical Course User Index [CA] Suzy Bouchard, PA-C  Medical Decision Making Amount and/or Complexity of Data Reviewed External Data Reviewed: notes. Labs: ordered. Decision-making details documented in ED Course.  Risk Prescription drug management.  This patient presents to the ED for concern of diarrhea this involves an extensive number of treatment options, and is a complaint that carries with it a high risk of complications and morbidity.  The differential diagnosis includes viral etiology, acute abdomen   61 year old female presents to the ED due to lower abdominal cramping associated with diarrhea and nausea that started yesterday.  No fever or chills.  No vaginal or urinary symptoms.  Upon arrival, vitals all within normal limits.  Patient in no acute distress.  Denies any melena, hematochezia, or hematemesis.  Patient no acute distress.  Very mild tenderness to lower quadrants without rebound or guarding.  Low suspicion for acute abdomen.  Suspect viral etiology.  IV fluids and Zofran given.  Abdominal labs ordered.  CBC reassuring.  No leukocytosis and normal hemoglobin.  CMP significant for elevated creatinine at 1.46.  Normal BUN.  No major electrolyte derangements.  Mild hyperglycemia 104.  No evidence of DKA.  UA significant for ketonuria likely due to dehydration.  No signs of infection. Doubt acute cystitis.  5:09 PM reassessed patient at bedside. She notes improvement in symptoms. She still admits to come abdominal cramping. Bentyl given.  Abdomen  soft, nondistended with mild tenderness.  Low suspicion for acute abdomen.  Suspect viral gastroenteritis.  COVID/influenza negative.   Patient tolerating p.o. without difficulty.  Patient discharged with Zofran and Bentyl.  Advised patient take Imodium as needed for diarrhea. Strict ED precautions discussed with patient. Patient states understanding and agrees to plan. Patient discharged home in no acute distress and stable vitals  Critical Interventions:  IVFs  Reevaluation:  After the interventions noted above, I reevaluated the patient and found that they have :improved  Social Determinants of Health:  No PCP        Final Clinical Impression(s) / ED Diagnoses Final diagnoses:  Diarrhea, unspecified type  Nausea    Rx / DC Orders ED Discharge Orders          Ordered    ondansetron (ZOFRAN-ODT) 4 MG disintegrating tablet  Every 8 hours PRN        10/06/21 1708    dicyclomine (BENTYL) 20 MG tablet  2 times daily        10/06/21 1708              Karie Kirks 10/06/21 1712    Truddie Hidden, MD 10/07/21 1454

## 2021-10-06 NOTE — Discharge Instructions (Addendum)
It was a pleasure taking care of you today. As discussed, your labs were reassuring. I suspect you a viral infection causing your symptoms. I am sending you home with pain medication and nausea medication. Take as needed. You may also take over the counter imodium as needed for diarrhea. Return to the ER for new or worsening symptoms.

## 2021-10-06 NOTE — ED Triage Notes (Signed)
Abdominal pain with nausea 

## 2021-10-06 NOTE — ED Notes (Signed)
Pt given water at this time per PA.

## 2022-01-30 ENCOUNTER — Encounter: Payer: Self-pay | Admitting: Family Medicine

## 2022-01-30 ENCOUNTER — Ambulatory Visit (INDEPENDENT_AMBULATORY_CARE_PROVIDER_SITE_OTHER): Payer: Self-pay | Admitting: Family Medicine

## 2022-01-30 VITALS — BP 118/86 | HR 85 | Ht 66.0 in | Wt 182.6 lb

## 2022-01-30 DIAGNOSIS — Z1211 Encounter for screening for malignant neoplasm of colon: Secondary | ICD-10-CM

## 2022-01-30 DIAGNOSIS — R21 Rash and other nonspecific skin eruption: Secondary | ICD-10-CM

## 2022-01-30 DIAGNOSIS — L308 Other specified dermatitis: Secondary | ICD-10-CM

## 2022-01-30 DIAGNOSIS — Z1231 Encounter for screening mammogram for malignant neoplasm of breast: Secondary | ICD-10-CM

## 2022-01-30 DIAGNOSIS — E559 Vitamin D deficiency, unspecified: Secondary | ICD-10-CM

## 2022-01-30 DIAGNOSIS — K635 Polyp of colon: Secondary | ICD-10-CM

## 2022-01-30 DIAGNOSIS — Z114 Encounter for screening for human immunodeficiency virus [HIV]: Secondary | ICD-10-CM

## 2022-01-30 DIAGNOSIS — J301 Allergic rhinitis due to pollen: Secondary | ICD-10-CM

## 2022-01-30 DIAGNOSIS — R7301 Impaired fasting glucose: Secondary | ICD-10-CM

## 2022-01-30 DIAGNOSIS — Z1159 Encounter for screening for other viral diseases: Secondary | ICD-10-CM

## 2022-01-30 DIAGNOSIS — Z720 Tobacco use: Secondary | ICD-10-CM

## 2022-01-30 DIAGNOSIS — Z23 Encounter for immunization: Secondary | ICD-10-CM

## 2022-01-30 MED ORDER — TRIAMCINOLONE ACETONIDE 0.1 % EX CREA: 1.0000 "application " | TOPICAL_CREAM | Freq: Two times a day (BID) | CUTANEOUS | 0 refills | Status: AC

## 2022-01-30 NOTE — Progress Notes (Addendum)
New Patient Office Visit  Subjective:  Patient ID: Alexa Johnson, female    DOB: Sep 28, 1960  Age: 61 y.o. MRN: [id number]  CC:  Chief Complaint  Patient presents with   New Patient (Initial Visit)    Establishing care. Has been having GI problems onset off and on for about 3 years now, complains of having had accidents before. Due for pap will come back for this.    Back Pain    Pt complains of back pain onset for several months had a flare up on 01/28/22.   Rash    Has a spot on her right lower leg she would like looked at, onset approximately 07/12/2021.    HPI Alexa Johnson is a 61 y.o. female with past medical history of allergic rhinitis, asthma, and colon polyp presents for establishing care. GI: she was recently seen in the ED on 10/06/21 with diarrhea and abdominal pain complaints. Her symptoms were suspected of viral etiology, and she was discharged home with Zofran and Bentyl. She reports relief of symptoms with Bentyl. She has had occasional constipation and diarrhea, which she contributes to her diet. She reports increased stress at work and  increased stress with raising her 35 years old grandson. she mentions that he mother had colon polys that were cancerous and wants to make sure she doesn't have any polys of concern.   Rash: onset was several months ago on her right lower leg. No changes in the size and appearance of the rash since onset. She reports dryness and itching with the rash. She has used OTC moisturizer with mild relief. She denies changes in detergent, soap, or exposure to poison Ivy.    Past Medical History:  Diagnosis Date   Migraines    Pneumonia    Sinusitis     Past Surgical History:  Procedure Laterality Date   CESAREAN SECTION      Family History  Problem Relation Age of Onset   Diabetes Mother    Hypertension Mother    Diabetes Father    Cancer Father    Heart failure Other    Cancer Other     Social History   Socioeconomic History    Marital status: Legally Separated    Spouse name: Not on file   Number of children: Not on file   Years of education: Not on file   Highest education level: Not on file  Occupational History   Not on file  Tobacco Use   Smoking status: Every Day    Packs/day: 0.50    Types: Cigarettes   Smokeless tobacco: Never  Vaping Use   Vaping Use: Never used  Substance and Sexual Activity   Alcohol use: Yes    Comment: rarely   Drug use: No   Sexual activity: Never  Other Topics Concern   Not on file  Social History Narrative   Not on file   Social Determinants of Health   Financial Resource Strain: Not on file  Food Insecurity: Not on file  Transportation Needs: Not on file  Physical Activity: Not on file  Stress: Not on file  Social Connections: Not on file  Intimate Partner Violence: Not on file    ROS Review of Systems  Constitutional:  Negative for chills, fatigue and fever.  HENT:  Positive for postnasal drip. Negative for congestion, rhinorrhea, sinus pressure, sinus pain and sore throat.   Eyes:  Positive for itching. Negative for photophobia, pain and redness.  Respiratory:  Negative for  chest tightness and shortness of breath.   Cardiovascular:  Negative for chest pain and palpitations.  Gastrointestinal:  Negative for abdominal pain, constipation, diarrhea, nausea and vomiting.  Endocrine: Negative for polydipsia, polyphagia and polyuria.  Genitourinary:  Negative for frequency and urgency.  Musculoskeletal:  Negative for back pain and neck pain.  Skin:  Positive for rash.  Neurological:  Negative for dizziness, tremors, weakness, numbness and headaches.  Psychiatric/Behavioral:  Negative for confusion, self-injury and suicidal ideas.    Objective:   Today's Vitals: BP 118/86   Pulse 85   Ht $R'5\' 6"'AR$  (1.676 m)   Wt 182 lb 9.6 oz (82.8 kg)   SpO2 96%   BMI 29.47 kg/m   Physical Exam Constitutional:      General: She is not in acute distress. HENT:      Right Ear: External ear normal.     Left Ear: External ear normal.     Nose: No congestion or rhinorrhea.     Mouth/Throat:     Mouth: Mucous membranes are moist.     Dentition: Has dentures.  Eyes:     Extraocular Movements: Extraocular movements intact.     Pupils: Pupils are equal, round, and reactive to light.  Cardiovascular:     Rate and Rhythm: Normal rate and regular rhythm.     Pulses: Normal pulses.     Heart sounds: Normal heart sounds.  Pulmonary:     Effort: Pulmonary effort is normal.     Breath sounds: Normal breath sounds.  Abdominal:     General: There is no distension.     Palpations: Abdomen is soft.     Tenderness: There is no abdominal tenderness.  Musculoskeletal:        General: Normal range of motion.     Cervical back: No rigidity or tenderness.     Right lower leg: No edema.     Left lower leg: No edema.  Skin:    Findings: Rash (single dry erythemaotus poorly circumscribed plaque on the right leg) present.  Neurological:     Mental Status: She is alert and oriented to person, place, and time.  Psychiatric:     Comments: Normal affect    Assessment & Plan:   Problem List Items Addressed This Visit       Respiratory   Allergic rhinitis   Relevant Orders   CBC with Differential/Platelet   CMP14+EGFR   Lipid panel   TSH + free T4     Digestive   Colon polyp    -Reports that he mother had colon polys that were cancerous and wants to make sure she doesn't have any polys of concern.  -Referral to GI for colonoscopy         Musculoskeletal and Integument   Rash    -assessment findings are likely of eczema rash on the right lower leg -will treat with kenalog cream and reassess at next visit         Other   Tobacco use    Smokes about 15 cigarettes daily  Asked about quitting: confirms that he/she currently smokes cigarettes Advise to quit smoking: Educated about QUITTING to reduce the risk of cancer, cardio and cerebrovascular  disease. Assess willingness: Unwilling to quit at this time, but is working on cutting back. Assist with counseling and pharmacotherapy: Counseled for 5 minutes and literature provided. Arrange for follow up: follow up in 3 months and continue to offer help.      Other Visit Diagnoses  Colon cancer screening    -  Primary   Relevant Orders   Ambulatory referral to Gastroenterology   Breast cancer screening by mammogram       Relevant Orders   MM 3D SCREEN BREAST BILATERAL   Other eczema       Relevant Medications   triamcinolone cream (KENALOG) 0.1 %   Need for hepatitis C screening test       Relevant Orders   Hepatitis C antibody   Encounter for screening for HIV       Relevant Orders   HIV antibody (with reflex)   Vitamin D deficiency       Relevant Orders   Vitamin D (25 hydroxy)   IFG (impaired fasting glucose)       Relevant Orders   Hemoglobin A1c   Need for shingles vaccine       Relevant Orders   Varicella-zoster vaccine IM (Shingrix) (Completed)       Outpatient Encounter Medications as of 01/30/2022  Medication Sig   albuterol (VENTOLIN HFA) 108 (90 Base) MCG/ACT inhaler Inhale 1-2 puffs into the lungs every 6 (six) hours as needed for wheezing or shortness of breath.   aspirin-acetaminophen-caffeine (EXCEDRIN MIGRAINE) 250-250-65 MG tablet Take 2 tablets by mouth every 6 (six) hours as needed for headache.   Carboxymethylcellulose Sodium (THERATEARS OP) Place 1 drop into both eyes daily as needed (dry eyes).   dicyclomine (BENTYL) 20 MG tablet Take 1 tablet (20 mg total) by mouth 2 (two) times daily.   famotidine (PEPCID) 10 MG tablet Take 20 mg by mouth daily as needed for heartburn or indigestion.   fexofenadine (ALLEGRA) 180 MG tablet Take 180 mg by mouth daily.   FIBER PO Take 5 capsules by mouth daily.   fluticasone (FLONASE) 50 MCG/ACT nasal spray Place 2 sprays into both nostrils daily. (Patient taking differently: Place 2 sprays into both nostrils  daily as needed for allergies.)   ibuprofen (ADVIL) 600 MG tablet Take 600 mg by mouth every 8 (eight) hours as needed for moderate pain.   Multiple Vitamin (MULTIVITAMIN) tablet Take 1 tablet by mouth daily.   naproxen (NAPROSYN) 500 MG tablet Take 1 tablet (500 mg total) by mouth 2 (two) times daily.   Omega-3 Fatty Acids (OMEGA 3 PO) Take 1,200 mg by mouth daily.   triamcinolone cream (KENALOG) 0.1 % Apply 1 application. topically 2 (two) times daily.   calcium carbonate (TUMS - DOSED IN MG ELEMENTAL CALCIUM) 500 MG chewable tablet Chew 1,000-1,500 mg by mouth daily as needed for indigestion or heartburn. (Patient not taking: Reported on 01/30/2022)   cephALEXin (KEFLEX) 500 MG capsule Take 1 capsule (500 mg total) by mouth 2 (two) times daily. (Patient not taking: Reported on 11/19/2020)   gabapentin (NEURONTIN) 300 MG capsule Take 300 mg by mouth 2 (two) times daily as needed (pain). (Patient not taking: Reported on 01/30/2022)   Ketotifen Fumarate (ALAWAY OP) Place 1 drop into both eyes daily as needed (allergies). (Patient not taking: Reported on 01/30/2022)   ondansetron (ZOFRAN-ODT) 4 MG disintegrating tablet Take 1 tablet (4 mg total) by mouth every 8 (eight) hours as needed for nausea or vomiting.   oxyCODONE-acetaminophen (PERCOCET) 5-325 MG tablet Take 1-2 tablets by mouth every 6 (six) hours as needed.   Phenylephrine-Acetaminophen (TYLENOL SINUS+HEADACHE PO) Take 2 tablets by mouth daily as needed (sinus headaches).   No facility-administered encounter medications on file as of 01/30/2022.    Follow-up: No follow-ups on file.   Peter Congo  Louanna Raw, FNP

## 2022-01-30 NOTE — Assessment & Plan Note (Signed)
-  assessment findings are likely of eczema rash on the right lower leg -will treat with kenalog cream and reassess at next visit

## 2022-01-30 NOTE — Assessment & Plan Note (Signed)
-  Reports that he mother had colon polys that were cancerous and wants to make sure she doesn't have any polys of concern.  -Referral to GI for colonoscopy

## 2022-01-30 NOTE — Assessment & Plan Note (Signed)
Smokes about 15 cigarettes daily  Asked about quitting: confirms that he/she currently smokes cigarettes Advise to quit smoking: Educated about QUITTING to reduce the risk of cancer, cardio and cerebrovascular disease. Assess willingness: Unwilling to quit at this time, but is working on cutting back. Assist with counseling and pharmacotherapy: Counseled for 5 minutes and literature provided. Arrange for follow up: follow up in 3 months and continue to offer help.

## 2022-01-30 NOTE — Patient Instructions (Addendum)
I appreciate the opportunity to provide care to you today!    Follow up:  3 months  Labs: please stop by the lab today to get your blood drawn (CBC, CMP, TSH, Lipid profile, HgA1c, Vit D)  Screening: HIV and Hep C   Please pick up your medication at the pharmacy (kenalog cream)   Please stop by [facility] hospital anytime to get your mammogram  Thank you for getting your Shingles vaccine  Referral: GI for your colonoscopy     Please continue to a heart-healthy diet and increase your physical activities. Try to exercise for 75mns at least three times a week.      It was a pleasure to see you and I look forward to continuing to work together on your health and well-being. Please do not hesitate to call the office if you need care or have questions about your care.   Have a wonderful day and week. With Gratitude, GAlvira MondayMSN, FNP-BC

## 2022-01-31 ENCOUNTER — Encounter: Payer: Self-pay | Admitting: Family Medicine

## 2022-01-31 ENCOUNTER — Encounter: Payer: Self-pay | Admitting: Internal Medicine

## 2022-01-31 LAB — CBC WITH DIFFERENTIAL/PLATELET
Basophils Absolute: 0.1 10*3/uL (ref 0.0–0.2)
Basos: 1 %
EOS (ABSOLUTE): 0.4 10*3/uL (ref 0.0–0.4)
Eos: 5 %
Hematocrit: 38.5 % (ref 34.0–46.6)
Hemoglobin: 12.9 g/dL (ref 11.1–15.9)
Immature Grans (Abs): 0 10*3/uL (ref 0.0–0.1)
Immature Granulocytes: 0 %
Lymphocytes Absolute: 3.6 10*3/uL — ABNORMAL HIGH (ref 0.7–3.1)
Lymphs: 49 %
MCH: 31.9 pg (ref 26.6–33.0)
MCHC: 33.5 g/dL (ref 31.5–35.7)
MCV: 95 fL (ref 79–97)
Monocytes Absolute: 0.5 10*3/uL (ref 0.1–0.9)
Monocytes: 7 %
Neutrophils Absolute: 2.7 10*3/uL (ref 1.4–7.0)
Neutrophils: 38 %
Platelets: 237 10*3/uL (ref 150–450)
RBC: 4.05 x10E6/uL (ref 3.77–5.28)
RDW: 12.2 % (ref 11.7–15.4)
WBC: 7.2 10*3/uL (ref 3.4–10.8)

## 2022-01-31 LAB — CMP14+EGFR
ALT: 23 IU/L (ref 0–32)
AST: 24 IU/L (ref 0–40)
Albumin/Globulin Ratio: 2.1 (ref 1.2–2.2)
Albumin: 4.5 g/dL (ref 3.8–4.9)
Alkaline Phosphatase: 63 IU/L (ref 44–121)
BUN/Creatinine Ratio: 19 (ref 12–28)
BUN: 13 mg/dL (ref 8–27)
Bilirubin Total: 0.4 mg/dL (ref 0.0–1.2)
CO2: 23 mmol/L (ref 20–29)
Calcium: 9 mg/dL (ref 8.7–10.3)
Chloride: 102 mmol/L (ref 96–106)
Creatinine, Ser: 0.68 mg/dL (ref 0.57–1.00)
Globulin, Total: 2.1 g/dL (ref 1.5–4.5)
Glucose: 90 mg/dL (ref 70–99)
Potassium: 4.4 mmol/L (ref 3.5–5.2)
Sodium: 139 mmol/L (ref 134–144)
Total Protein: 6.6 g/dL (ref 6.0–8.5)
eGFR: 100 mL/min/{1.73_m2} (ref >59)

## 2022-01-31 LAB — LIPID PANEL
Chol/HDL Ratio: 5.1 ratio — ABNORMAL HIGH (ref 0.0–4.4)
Cholesterol, Total: 236 mg/dL — ABNORMAL HIGH (ref 100–199)
HDL: 46 mg/dL (ref >39)
LDL Chol Calc (NIH): 173 mg/dL — ABNORMAL HIGH (ref 0–99)
Triglycerides: 94 mg/dL (ref 0–149)
VLDL Cholesterol Cal: 17 mg/dL (ref 5–40)

## 2022-01-31 LAB — HEMOGLOBIN A1C
Est. average glucose Bld gHb Est-mCnc: 120 mg/dL
Hgb A1c MFr Bld: 5.8 % — ABNORMAL HIGH (ref 4.8–5.6)

## 2022-01-31 LAB — VITAMIN D 25 HYDROXY (VIT D DEFICIENCY, FRACTURES): Vit D, 25-Hydroxy: 13.5 ng/mL — ABNORMAL LOW (ref 30.0–100.0)

## 2022-01-31 LAB — HEPATITIS C ANTIBODY: Hep C Virus Ab: NONREACTIVE

## 2022-01-31 LAB — TSH+FREE T4
Free T4: 1.2 ng/dL (ref 0.82–1.77)
TSH: 1.64 u[IU]/mL (ref 0.450–4.500)

## 2022-01-31 LAB — HIV ANTIBODY (ROUTINE TESTING W REFLEX): HIV Screen 4th Generation wRfx: NONREACTIVE

## 2022-02-03 ENCOUNTER — Other Ambulatory Visit: Payer: Self-pay | Admitting: Family Medicine

## 2022-02-03 DIAGNOSIS — E559 Vitamin D deficiency, unspecified: Secondary | ICD-10-CM

## 2022-02-03 DIAGNOSIS — E785 Hyperlipidemia, unspecified: Secondary | ICD-10-CM

## 2022-02-03 MED ORDER — ROSUVASTATIN CALCIUM 10 MG PO TABS: 10.0000 mg | ORAL_TABLET | Freq: Every day | ORAL | 3 refills | Status: AC

## 2022-02-03 MED ORDER — VITAMIN D (ERGOCALCIFEROL) 1.25 MG (50000 UNIT) PO CAPS: 50000.0000 [IU] | ORAL_CAPSULE | ORAL | 1 refills | Status: AC

## 2022-02-03 NOTE — Progress Notes (Signed)
Please inform the patient that I've sent a Vit.D and Rosuvastatin prescription. Her vit D level is low, and her cholesterol is elevated. Her labs show that she has prediabetes.   Lifestyle changes include weight loss and exercising at least 3 days a week for 150 min weekly. I also recommend a cholesterol-lowering diet low in fat or saturated fat and implementing the Mediterranean diet, which emphasizes fruits, vegetables, whole grains, beans, nuts, seeds, and healthy fats.

## 2022-02-03 NOTE — Progress Notes (Unsigned)
The 10-year ASCVD risk score (Arnett DK, et al., 2019) is: 7.6%   Values used to calculate the score:     Age: 61 years     Sex: Female     Is Non-Hispanic African American: No     Diabetic: No     Tobacco smoker: Yes     Systolic Blood Pressure: 973 mmHg     Is BP treated: No     HDL Cholesterol: 46 mg/dL     Total Cholesterol: 236 mg/dL

## 2022-02-05 ENCOUNTER — Encounter (HOSPITAL_COMMUNITY): Payer: Self-pay

## 2022-02-05 ENCOUNTER — Emergency Department (HOSPITAL_COMMUNITY)
Admission: EM | Admit: 2022-02-05 | Discharge: 2022-02-05 | Disposition: A | Discharge status: Home / Self Care | Payer: Self-pay | Attending: Emergency Medicine | Admitting: Emergency Medicine

## 2022-02-05 ENCOUNTER — Emergency Department (HOSPITAL_COMMUNITY): Payer: Self-pay

## 2022-02-05 ENCOUNTER — Other Ambulatory Visit: Payer: Self-pay

## 2022-02-05 DIAGNOSIS — M546 Pain in thoracic spine: Secondary | ICD-10-CM | POA: Insufficient documentation

## 2022-02-05 DIAGNOSIS — T148XXA Other injury of unspecified body region, initial encounter: Secondary | ICD-10-CM

## 2022-02-05 DIAGNOSIS — F17200 Nicotine dependence, unspecified, uncomplicated: Secondary | ICD-10-CM | POA: Insufficient documentation

## 2022-02-05 MED ORDER — LIDOCAINE 5 % EX PTCH: 1.0000 | MEDICATED_PATCH | CUTANEOUS | 0 refills | Status: AC

## 2022-02-05 MED ORDER — KETOROLAC TROMETHAMINE 30 MG/ML IJ SOLN
30.0000 mg | Freq: Once | INTRAMUSCULAR | Status: AC
  Administered 2022-02-05: 30 mg via INTRAMUSCULAR
  Filled 2022-02-05: qty 1

## 2022-02-05 MED ORDER — LIDOCAINE 5 % EX PTCH
1.0000 | MEDICATED_PATCH | CUTANEOUS | Status: DC
  Administered 2022-02-05: 1 via TRANSDERMAL
  Filled 2022-02-05: qty 1

## 2022-02-05 NOTE — ED Provider Notes (Signed)
[facility] EMERGENCY DEPARTMENT Provider Note   CSN: [id number] Arrival date & time: 02/05/22  0915     History  Chief Complaint  Patient presents with   Back Pain    Alexa Johnson is a 61 y.o. female.   Back Pain  Patient with medical history of tobacco dependence presents today due to upper back pain.  It started 2 weeks ago, the pain is intermittent and worse with movement and specifically washing dishes at work.  It comes and goes, feels like a sharp pain symptoms feels like a burning pain.  It does not radiate elsewhere, denies she also feels it when she is driving.  She tried Excedrin and a pain patch which is helped somewhat but she is still having persistent pain.  Denies any specific injury to the back other than working more hours recently.    Denies any dysuria or hematuria, no saddle anesthesia, no bilateral lower extremity weakness, no history of cancer, no previous back surgeries, no IV drug use history, no chest pain or shortness of breath.  Home Medications Prior to Admission medications   Medication Sig Start Date End Date Taking? Authorizing Provider  lidocaine (LIDODERM) 5 % Place 1 patch onto the skin daily. Remove & Discard patch within 12 hours or as directed by MD 02/05/22  Yes Sherrill Raring, PA-C  albuterol (VENTOLIN HFA) 108 (90 Base) MCG/ACT inhaler Inhale 1-2 puffs into the lungs every 6 (six) hours as needed for wheezing or shortness of breath.    [provider]  aspirin-acetaminophen-caffeine (EXCEDRIN MIGRAINE) 520 294 0312 MG tablet Take 2 tablets by mouth every 6 (six) hours as needed for headache.    [provider]  calcium carbonate (TUMS - DOSED IN MG ELEMENTAL CALCIUM) 500 MG chewable tablet Chew 1,000-1,500 mg by mouth daily as needed for indigestion or heartburn. Patient not taking: Reported on 01/30/2022    [provider]  Carboxymethylcellulose Sodium (THERATEARS OP) Place 1 drop into both eyes daily as needed (dry  eyes).    [provider]  cephALEXin (KEFLEX) 500 MG capsule Take 1 capsule (500 mg total) by mouth 2 (two) times daily. Patient not taking: Reported on 11/19/2020 08/19/20   Orpah Greek, MD  dicyclomine (BENTYL) 20 MG tablet Take 1 tablet (20 mg total) by mouth 2 (two) times daily. 10/06/21   Suzy Bouchard, PA-C  famotidine (PEPCID) 10 MG tablet Take 20 mg by mouth daily as needed for heartburn or indigestion.    [provider]  fexofenadine (ALLEGRA) 180 MG tablet Take 180 mg by mouth daily.    [provider]  FIBER PO Take 5 capsules by mouth daily.    [provider]  fluticasone (FLONASE) 50 MCG/ACT nasal spray Place 2 sprays into both nostrils daily. Patient taking differently: Place 2 sprays into both nostrils daily as needed for allergies. 10/11/16   Triplett, Tammy, PA-C  gabapentin (NEURONTIN) 300 MG capsule Take 300 mg by mouth 2 (two) times daily as needed (pain). Patient not taking: Reported on 01/30/2022 05/06/20   [provider]  ibuprofen (ADVIL) 600 MG tablet Take 600 mg by mouth every 8 (eight) hours as needed for moderate pain.    [provider]  Ketotifen Fumarate (ALAWAY OP) Place 1 drop into both eyes daily as needed (allergies). Patient not taking: Reported on 01/30/2022    [provider]  Multiple Vitamin (MULTIVITAMIN) tablet Take 1 tablet by mouth daily.    [provider]  naproxen (NAPROSYN)  500 MG tablet Take 1 tablet (500 mg total) by mouth 2 (two) times daily. 06/03/21   Veryl Speak, MD  Omega-3 Fatty Acids (OMEGA 3 PO) Take 1,200 mg by mouth daily.    [provider]  ondansetron (ZOFRAN-ODT) 4 MG disintegrating tablet Take 1 tablet (4 mg total) by mouth every 8 (eight) hours as needed for nausea or vomiting. 10/06/21   Suzy Bouchard, PA-C  oxyCODONE-acetaminophen (PERCOCET) 5-325 MG tablet Take 1-2 tablets by mouth every 6 (six) hours as needed. 06/03/21   Veryl Speak, MD  Phenylephrine-Acetaminophen (TYLENOL SINUS+HEADACHE PO) Take 2 tablets by mouth daily as needed (sinus headaches).    [provider]  rosuvastatin (CRESTOR) 10 MG tablet Take 1 tablet (10 mg total) by mouth daily. 02/03/22   Alvira Monday, FNP  triamcinolone cream (KENALOG) 0.1 % Apply 1 application. topically 2 (two) times daily. 01/30/22   Alvira Monday, FNP  Vitamin D, Ergocalciferol, (DRISDOL) 1.25 MG (50000 UNIT) CAPS capsule Take 1 capsule (50,000 Units total) by mouth every 7 (seven) days. 02/03/22   Alvira Monday, FNP      Allergies    Hydrocodone, Oxycodone, and Sulfa antibiotics    Review of Systems   Review of Systems  Musculoskeletal:  Positive for back pain.   Physical Exam Updated Vital Signs BP 121/81   Pulse 76   Temp (!) 97.2 F (36.2 C)   Resp 17   Ht '5\' 6"'$  (1.676 m)   Wt 82.6 kg   SpO2 97%   BMI 29.38 kg/m  Physical Exam Vitals and nursing note reviewed. Exam conducted with a chaperone present.  Constitutional:      General: She is not in acute distress.    Appearance: Normal appearance.  HENT:     Head: Normocephalic and atraumatic.  Eyes:     General: No scleral icterus.    Extraocular Movements: Extraocular movements intact.     Pupils: Pupils are equal, round, and reactive to light.  Cardiovascular:     Rate and Rhythm: Normal rate and regular rhythm.     Pulses: Normal pulses.  Pulmonary:     Effort: Pulmonary effort is normal.     Breath sounds: Normal breath sounds.  Abdominal:     General: Abdomen is flat.     Tenderness: There is no right CVA tenderness or left CVA tenderness.  Musculoskeletal:        General: Tenderness present.     Comments: Tenderness to T10, also surrounding paraspinal tenderness.  No lower lumbar tenderness, no CVA tenderness.  Skin:    Coloration: Skin is not jaundiced.     Comments: No shingles or rash  Neurological:     Mental Status: She is alert. Mental status is at baseline.      Coordination: Coordination normal.     Comments: Cranial nerves III through XII are grossly intact.  Grip strength is equal bilaterally, raises both lower extremities without any difficulty.  Plantarflexion dorsiflexion 5/5 ambulatory with steady gait    ED Results / Procedures / Treatments   Labs (all labs ordered are listed, but only abnormal results are displayed) Labs Reviewed - No data to display  EKG None  Radiology DG Thoracic Spine W/Swimmers  Result Date: 02/05/2022 CLINICAL DATA:  Intermittent back pain for the past 2 weeks. No known injury. EXAM: THORACIC SPINE - 3 VIEWS COMPARISON:  None Available. FINDINGS: Mild anterior spur formation and associated disc space narrowing at multiple levels of the thoracic spine. Lower  cervical spine degenerative changes. No fractures or subluxations seen. IMPRESSION: Thoracic and cervical spine degenerative changes. Electronically Signed   By: Claudie Revering M.D.   On: 02/05/2022 10:03    Procedures Procedures    Medications Ordered in ED Medications  lidocaine (LIDODERM) 5 % 1 patch (has no administration in time range)  ketorolac (TORADOL) 30 MG/ML injection 30 mg (has no administration in time range)    ED Course/ Medical Decision Making/ A&P                           Medical Decision Making Amount and/or Complexity of Data Reviewed Radiology: ordered.  Risk Prescription drug management.   Patient presents with midthoracic back pain.  Differential diagnosis includes but not limited to muscle strain,Dissection, OA, occult fracture, PE.  There are no focal deficits on neuro exam, radial pulse 2+ equal bilaterally.  S1-S2 without hypoxia, patient is not febrile.  Given there is no pleuritic pain or chest pain I think PE is unlikely, patient is not tachycardic hypoxic.  I considered dissection but radial pulse 2+ equal bilaterally, no chest pain and blood pressure well controlled.  Given pain is reproducible I suspect this is more  musculoskeletal than cardiovascular in nature.  Given the pain is midline we will proceed with radiographic imaging.  Patient does not have lower back pain, I doubt this is cauda equina.  No dysuria or hematuria, doubt UTI or pyelonephritis.  Reviewed the thoracic spine x-ray and agree with radiologist interpretation.  Patient has degenerative changes but no occult fracture.  Discussed results with patient, she has a primary care doctor she is able to follow-up with.  Encourage anti-inflammatories and Lidoderm patches, return precautions discussed and patient was discharged in stable condition.        Final Clinical Impression(s) / ED Diagnoses Final diagnoses:  Muscle strain    Rx / DC Orders ED Discharge Orders          Ordered    lidocaine (LIDODERM) 5 %  Every 24 hours        02/05/22 1007              Sherrill Raring, PA-C 02/05/22 1015    Godfrey Pick, MD 02/06/22 1746

## 2022-02-05 NOTE — ED Triage Notes (Signed)
Patient reports back pain intermittently for the past two weeks. Denies injury. States that pain is generalized.

## 2022-02-05 NOTE — Discharge Instructions (Addendum)
Take Tylenol Motrin for the back pain.  You can use the Lidoderm patches if they help alleviate the pain.  Your x-ray did not show any fractures, you do have some degenerative changes which can happen with aging and are nonspecific.  I would follow-up with your primary next week for reevaluation.  Return to the ED if you have new or concerning symptoms.

## 2022-02-13 ENCOUNTER — Encounter: Payer: Self-pay | Admitting: Family Medicine

## 2022-02-13 ENCOUNTER — Ambulatory Visit (INDEPENDENT_AMBULATORY_CARE_PROVIDER_SITE_OTHER): Payer: Self-pay | Admitting: Family Medicine

## 2022-02-13 ENCOUNTER — Inpatient Hospital Stay (HOSPITAL_COMMUNITY): Admission: RE | Admit: 2022-02-13 | Payer: BLUE CROSS/BLUE SHIELD | Source: Ambulatory Visit

## 2022-02-13 DIAGNOSIS — J069 Acute upper respiratory infection, unspecified: Secondary | ICD-10-CM

## 2022-02-13 NOTE — Progress Notes (Signed)
Virtual Visit via Telephone Note   This visit type was conducted due to national recommendations for restrictions regarding the COVID-19 Pandemic (e.g. social distancing) in an effort to limit this patient's exposure and mitigate transmission in our community.  Due to her co-morbid illnesses, this patient is at least at moderate risk for complications without adequate follow up.  This format is felt to be most appropriate for this patient at this time.  The patient did not have access to video technology/had technical difficulties with video requiring transitioning to audio format only (telephone).  All issues noted in this document were discussed and addressed.  No physical exam could be performed with this format.  Please refer to the patient's chart for her  consent to telehealth for [facility].   Evaluation Performed:  Acute visit  Date:  02/13/2022   ID:  Alexa Johnson, DOB [id number], MRN [id number]  Patient Location: Home Provider Location: Office/Clinic  Participants: Patient Location of Patient: Home Location of Provider: Telehealth Consent was obtain for visit to be over via telehealth. I verified that I am speaking with the correct person using two identifiers.  PCP:  Alvira Monday, FNP   Chief Complaint:  sore throat  History of Present Illness:   Alexa Johnson is a 61 y.o. female with c/o of sore throat, coughing, body aches, runny nose, nasal congestion, cough with clear phlegm for 1 day. She reports taking Excedrin and denies fever. She states that her symptoms started in the middle of the night/ early morning. She is staying well-hydrated and eating appropriately.   The patient does not have symptoms concerning for COVID-19 infection (fever, chills, cough, or new shortness of breath).   Past Medical, Surgical, Social History, Allergies, and Medications have been Reviewed.  Past Medical History:  Diagnosis Date   Migraines    Pneumonia    Sinusitis     Past Surgical History:  Procedure Laterality Date   CESAREAN SECTION       Current Meds  Medication Sig   albuterol (VENTOLIN HFA) 108 (90 Base) MCG/ACT inhaler Inhale 1-2 puffs into the lungs every 6 (six) hours as needed for wheezing or shortness of breath.   aspirin-acetaminophen-caffeine (EXCEDRIN MIGRAINE) 250-250-65 MG tablet Take 2 tablets by mouth every 6 (six) hours as needed for headache.   Carboxymethylcellulose Sodium (THERATEARS OP) Place 1 drop into both eyes daily as needed (dry eyes).   dicyclomine (BENTYL) 20 MG tablet Take 1 tablet (20 mg total) by mouth 2 (two) times daily.   famotidine (PEPCID) 10 MG tablet Take 20 mg by mouth daily as needed for heartburn or indigestion.   fexofenadine (ALLEGRA) 180 MG tablet Take 180 mg by mouth daily.   FIBER PO Take 5 capsules by mouth daily.   gabapentin (NEURONTIN) 300 MG capsule Take 300 mg by mouth 2 (two) times daily as needed (pain).   ibuprofen (ADVIL) 600 MG tablet Take 600 mg by mouth every 8 (eight) hours as needed for moderate pain.   Ketotifen Fumarate (ALAWAY OP) Place 1 drop into both eyes daily as needed (allergies).   lidocaine (LIDODERM) 5 % Place 1 patch onto the skin daily. Remove & Discard patch within 12 hours or as directed by MD   Multiple Vitamin (MULTIVITAMIN) tablet Take 1 tablet by mouth daily.   naproxen (NAPROSYN) 500 MG tablet Take 1 tablet (500 mg total) by mouth 2 (two) times daily.   Omega-3 Fatty Acids (OMEGA 3 PO) Take 1,200 mg by mouth  daily.   ondansetron (ZOFRAN-ODT) 4 MG disintegrating tablet Take 1 tablet (4 mg total) by mouth every 8 (eight) hours as needed for nausea or vomiting.   oxyCODONE-acetaminophen (PERCOCET) 5-325 MG tablet Take 1-2 tablets by mouth every 6 (six) hours as needed.   Phenylephrine-Acetaminophen (TYLENOL SINUS+HEADACHE PO) Take 2 tablets by mouth daily as needed (sinus headaches).   rosuvastatin (CRESTOR) 10 MG tablet Take 1 tablet (10 mg total) by mouth daily.    triamcinolone cream (KENALOG) 0.1 % Apply 1 application. topically 2 (two) times daily.   Vitamin D, Ergocalciferol, (DRISDOL) 1.25 MG (50000 UNIT) CAPS capsule Take 1 capsule (50,000 Units total) by mouth every 7 (seven) days.     Allergies:   Hydrocodone, Oxycodone, and Sulfa antibiotics   ROS:   Please see the history of present illness.     All other systems reviewed and are negative.   Labs/Other Tests and Data Reviewed:    Recent Labs: 01/30/2022: ALT 23; BUN 13; Creatinine, Ser 0.68; Hemoglobin 12.9; Platelets 237; Potassium 4.4; Sodium 139; TSH 1.640   Recent Lipid Panel Lab Results  Component Value Date/Time   CHOL 236 (H) 01/30/2022 11:03 AM   TRIG 94 01/30/2022 11:03 AM   HDL 46 01/30/2022 11:03 AM   CHOLHDL 5.1 (H) 01/30/2022 11:03 AM   LDLCALC 173 (H) 01/30/2022 11:03 AM    Wt Readings from Last 3 Encounters:  02/05/22 182 lb (82.6 kg)  01/30/22 182 lb 9.6 oz (82.8 kg)  10/06/21 187 lb 3.2 oz (84.9 kg)     Objective:    Vital Signs:  There were no vitals taken for this visit.     ASSESSMENT & PLAN:   Acute URI -encouraged supportive treatment and symptomatic management  -continue staying hydrated -will reconvene if symptoms persist for >5-7 days    Time:   Today, I have spent 8 minutes reviewing the chart, including problem list, medications, and with the patient with telehealth technology discussing the above problems.   Medication Adjustments/Labs and Tests Ordered: Current medicines are reviewed at length with the patient today.  Concerns regarding medicines are outlined above.   Tests Ordered: No orders of the defined types were placed in this encounter.   Medication Changes: No orders of the defined types were placed in this encounter.          Disposition:  Follow up  Signed, Alvira Monday, FNP  02/13/2022 10:24 AM     [employer]

## 2022-03-06 ENCOUNTER — Ambulatory Visit: Payer: BLUE CROSS/BLUE SHIELD | Admitting: Gastroenterology

## 2022-03-08 ENCOUNTER — Ambulatory Visit: Payer: BLUE CROSS/BLUE SHIELD | Admitting: Gastroenterology

## 2022-03-20 ENCOUNTER — Telehealth: Payer: Self-pay

## 2022-03-20 NOTE — Telephone Encounter (Signed)
Voicemail note.

## 2022-05-02 ENCOUNTER — Ambulatory Visit: Payer: BLUE CROSS/BLUE SHIELD | Admitting: Family Medicine

## 2022-05-17 ENCOUNTER — Other Ambulatory Visit: Payer: Self-pay

## 2022-05-17 DIAGNOSIS — Z1231 Encounter for screening mammogram for malignant neoplasm of breast: Secondary | ICD-10-CM

## 2022-09-05 ENCOUNTER — Encounter (HOSPITAL_COMMUNITY): Payer: Self-pay | Admitting: Emergency Medicine

## 2022-09-05 ENCOUNTER — Other Ambulatory Visit: Payer: Self-pay

## 2022-09-05 ENCOUNTER — Emergency Department (HOSPITAL_COMMUNITY)
Admission: EM | Admit: 2022-09-05 | Discharge: 2022-09-05 | Discharge status: Against Medical Advice | Payer: Self-pay | Attending: Emergency Medicine | Admitting: Emergency Medicine

## 2022-09-05 DIAGNOSIS — R197 Diarrhea, unspecified: Secondary | ICD-10-CM | POA: Insufficient documentation

## 2022-09-05 DIAGNOSIS — R519 Headache, unspecified: Secondary | ICD-10-CM | POA: Insufficient documentation

## 2022-09-05 DIAGNOSIS — Z5321 Procedure and treatment not carried out due to patient leaving prior to being seen by health care provider: Secondary | ICD-10-CM | POA: Insufficient documentation

## 2022-09-05 DIAGNOSIS — R112 Nausea with vomiting, unspecified: Secondary | ICD-10-CM | POA: Insufficient documentation

## 2022-09-05 NOTE — ED Triage Notes (Signed)
Pt c/o headache since yesterday. Pt states she vomited twice and one episode of diarrhea.

## 2024-03-03 ENCOUNTER — Emergency Department (HOSPITAL_COMMUNITY)
Admission: EM | Admit: 2024-03-03 | Discharge: 2024-03-03 | Disposition: A | Discharge status: Home / Self Care | Payer: Self-pay | Attending: Emergency Medicine | Admitting: Emergency Medicine

## 2024-03-03 ENCOUNTER — Encounter (HOSPITAL_COMMUNITY): Payer: Self-pay

## 2024-03-03 ENCOUNTER — Other Ambulatory Visit: Payer: Self-pay

## 2024-03-03 DIAGNOSIS — N3001 Acute cystitis with hematuria: Secondary | ICD-10-CM | POA: Insufficient documentation

## 2024-03-03 LAB — URINALYSIS, ROUTINE W REFLEX MICROSCOPIC
Bilirubin Urine: NEGATIVE
Glucose, UA: NEGATIVE mg/dL
Ketones, ur: NEGATIVE mg/dL
Nitrite: NEGATIVE
Protein, ur: NEGATIVE mg/dL
Specific Gravity, Urine: 1.004 — ABNORMAL LOW (ref 1.005–1.030)
WBC, UA: >50 WBC/hpf (ref 0–5)
pH: 7 (ref 5.0–8.0)

## 2024-03-03 MED ORDER — NITROFURANTOIN MONOHYD MACRO 100 MG PO CAPS: 100.0000 mg | ORAL_CAPSULE | Freq: Two times a day (BID) | ORAL | 0 refills | Status: AC

## 2024-03-03 NOTE — Discharge Instructions (Signed)
 You were seen for your urinary tract infection in the emergency department.   At home, please take the Macrobid we have prescribed you.  Take Azo for discomfort.    Check your MyChart online for the results of any tests that had not resulted by the time you left the emergency department.   Follow-up with urology if the blood in your urine does not resolve because you may need cystoscopy to look for a tumor.  Return immediately to the emergency department if you experience any of the following: fevers, flank pain, or any other concerning symptoms.    Thank you for visiting our Emergency Department. It was a pleasure taking care of you today.

## 2024-03-03 NOTE — ED Triage Notes (Signed)
 Thursday morning noticed blood started AZO and cranberry juice. Haven't bled since Thursday. This morning noticed burning and urgency. Denies abdominal pain.

## 2024-03-03 NOTE — ED Provider Notes (Signed)
 [place] EMERGENCY DEPARTMENT AT [facility] Provider Note   CSN: [id number] Arrival date & time: 03/03/24  9387     Patient presents with: Urinary Tract Infection   Alexa Johnson is a 63 y.o. female.   63 year old female with history of UTI who presents emergency department with dysuria, urgency, and frequency.  Started on Thursday.  Thought her symptoms resolved and then came back today.  Has been trying cranberry and water for symptoms.  Denies any fevers, flank pain, nausea or vomiting.       Prior to Admission medications   Medication Sig Start Date End Date Taking? Authorizing Provider  nitrofurantoin, macrocrystal-monohydrate, (MACROBID) 100 MG capsule Take 1 capsule (100 mg total) by mouth 2 (two) times daily. 03/03/24  Yes Yolande Lamar BROCKS, MD  albuterol (VENTOLIN HFA) 108 (90 Base) MCG/ACT inhaler Inhale 1-2 puffs into the lungs every 6 (six) hours as needed for wheezing or shortness of breath.    [provider]  aspirin-acetaminophen -caffeine (EXCEDRIN MIGRAINE) 250-250-65 MG tablet Take 2 tablets by mouth every 6 (six) hours as needed for headache.    [provider]  calcium  carbonate (TUMS - DOSED IN MG ELEMENTAL CALCIUM ) 500 MG chewable tablet Chew 1,000-1,500 mg by mouth daily as needed for indigestion or heartburn. Patient not taking: Reported on 01/30/2022    [provider]  Carboxymethylcellulose Sodium (THERATEARS OP) Place 1 drop into both eyes daily as needed (dry eyes).    [provider]  cephALEXin  (KEFLEX ) 500 MG capsule Take 1 capsule (500 mg total) by mouth 2 (two) times daily. Patient not taking: Reported on 11/19/2020 08/19/20   Haze Lonni PARAS, MD  dicyclomine  (BENTYL ) 20 MG tablet Take 1 tablet (20 mg total) by mouth 2 (two) times daily. 10/06/21   Lorelle Aleck BROCKS, PA-C  famotidine (PEPCID) 10 MG tablet Take 20 mg by mouth daily as needed for heartburn or indigestion.    [provider]   fexofenadine (ALLEGRA) 180 MG tablet Take 180 mg by mouth daily.    [provider]  FIBER PO Take 5 capsules by mouth daily.    [provider]  fluticasone  (FLONASE ) 50 MCG/ACT nasal spray Place 2 sprays into both nostrils daily. Patient taking differently: Place 2 sprays into both nostrils daily as needed for allergies. 10/11/16   Triplett, Tammy, PA-C  gabapentin (NEURONTIN) 300 MG capsule Take 300 mg by mouth 2 (two) times daily as needed (pain). 05/06/20   [provider]  ibuprofen  (ADVIL ) 600 MG tablet Take 600 mg by mouth every 8 (eight) hours as needed for moderate pain.    [provider]  Ketotifen Fumarate (ALAWAY OP) Place 1 drop into both eyes daily as needed (allergies).    [provider]  lidocaine  (LIDODERM ) 5 % Place 1 patch onto the skin daily. Remove & Discard patch within 12 hours or as directed by MD 02/05/22   Emelia Sluder, PA-C  Multiple Vitamin (MULTIVITAMIN) tablet Take 1 tablet by mouth daily.    [provider]  naproxen  (NAPROSYN ) 500 MG tablet Take 1 tablet (500 mg total) by mouth 2 (two) times daily. 06/03/21   Geroldine Berg, MD  Omega-3 Fatty Acids (OMEGA 3 PO) Take 1,200 mg by mouth daily.    [provider]  ondansetron  (ZOFRAN -ODT) 4 MG disintegrating tablet Take 1 tablet (4 mg total) by mouth every 8 (eight) hours as needed for nausea or vomiting. 10/06/21   Lorelle Aleck BROCKS, PA-C  oxyCODONE -acetaminophen  (PERCOCET) 5-325  MG tablet Take 1-2 tablets by mouth every 6 (six) hours as needed. 06/03/21   Geroldine Berg, MD  Phenylephrine-Acetaminophen  (TYLENOL  SINUS+HEADACHE PO) Take 2 tablets by mouth daily as needed (sinus headaches).    [provider]  rosuvastatin  (CRESTOR ) 10 MG tablet Take 1 tablet (10 mg total) by mouth daily. 02/03/22   Zarwolo, Gloria, FNP  triamcinolone  cream (KENALOG ) 0.1 % Apply 1 application. topically 2 (two) times daily. 01/30/22   Zarwolo, Gloria, FNP  Vitamin D ,  Ergocalciferol , (DRISDOL ) 1.25 MG (50000 UNIT) CAPS capsule Take 1 capsule (50,000 Units total) by mouth every 7 (seven) days. 02/03/22   Zarwolo, Gloria, FNP    Allergies: Hydrocodone, Oxycodone , and Sulfa antibiotics    Review of Systems  Updated Vital Signs BP 108/75 (BP Location: Left Arm)   Pulse 70   Temp 97.8 F (36.6 C) (Oral)   Resp 15   Ht 5' 5 (1.651 m)   SpO2 94%   BMI 29.62 kg/m   Physical Exam Constitutional:      Appearance: Normal appearance.  Abdominal:     General: There is no distension.     Palpations: There is no mass.     Tenderness: There is no abdominal tenderness. There is no right CVA tenderness, left CVA tenderness or guarding.   Neurological:     Mental Status: She is alert.     (all labs ordered are listed, but only abnormal results are displayed) Labs Reviewed  URINALYSIS, ROUTINE W REFLEX MICROSCOPIC - Abnormal; Notable for the following components:      Result Value   Specific Gravity, Urine 1.004 (*)    Hgb urine dipstick MODERATE (*)    Leukocytes,Ua MODERATE (*)    Bacteria, UA FEW (*)    All other components within normal limits  URINE CULTURE    EKG: None  Radiology: No results found.   Procedures   Medications Ordered in the ED - No data to display                                  Medical Decision Making Amount and/or Complexity of Data Reviewed Labs: ordered.  Risk Prescription drug management.   64 year old female with a history of UTI presents to the emergency department with symptoms of UTI  Initial Ddx:  UTI, pyelonephritis, kidney stone, malignancy  MDM/Course:  Patient presents to the emergency department with symptoms of a UTI.  Does not appear to have pyelonephritis at this time.  Urinalysis consistent with UTI.  Prescribed Macrobid.  Instructed to follow-up with urology about her hematuria to see if she needs cystoscopy.  Return precautions discussed prior to discharge.   This patient presents to  the ED for concern of complaints listed in HPI, this involves an extensive number of treatment options, and is a complaint that carries with it a high risk of complications and morbidity. Disposition including potential need for admission considered.   Dispo: DC Home. Return precautions discussed including, but not limited to, those listed in the AVS. Allowed pt time to ask questions which were answered fully prior to dc.  The following labs were independently interpreted: Urinalysis and show urinary tract infection I have reviewed the patients home medications and made adjustments as needed  Portions of this note were generated with Dragon dictation software. Dictation errors may occur despite best attempts at proofreading.     Final diagnoses:  Acute cystitis with hematuria  ED Discharge Orders          Ordered    nitrofurantoin, macrocrystal-monohydrate, (MACROBID) 100 MG capsule  2 times daily        03/03/24 0731               Yolande Lamar BROCKS, MD 03/03/24 1620

## 2024-03-06 LAB — URINE CULTURE: Culture: 20000 — AB

## 2024-03-07 ENCOUNTER — Telehealth (HOSPITAL_BASED_OUTPATIENT_CLINIC_OR_DEPARTMENT_OTHER): Payer: Self-pay | Admitting: *Deleted

## 2024-03-07 NOTE — Telephone Encounter (Signed)
 Post ED Visit - Positive Culture Follow-up  Culture report reviewed by antimicrobial stewardship pharmacist: [facility] [x]  Vernell Meier, Pharm.D. []  Venetia Gully, Pharm.D., BCPS AQ-ID []  Garrel Crews, Pharm.D., BCPS []  Almarie Lunger, Pharm.D., BCPS []  [place], [address].D., BCPS, AAHIVP []  Rosaline Bihari, Pharm.D., BCPS, AAHIVP []  Vernell Meier, PharmD, BCPS []  Latanya Hint, PharmD, BCPS []  Donald Medley, PharmD, BCPS []  Rocky Bold, PharmD []  Dorothyann Alert, PharmD, BCPS []  Morene Babe, PharmD  [facility] []  Rosaline Edison, PharmD []  Romona Bliss, PharmD []  Dolphus Roller, PharmD []  Veva Seip, Rph []  Vernell Daunt) Leonce, PharmD []  Eva Allis, PharmD []  Rosaline Millet, PharmD []  Iantha Batch, PharmD []  Arvin Gauss, PharmD []  Wanda Hasting, PharmD []  Ronal Rav, PharmD []  Rocky Slade, PharmD []  Bard Jeans, PharmD   Positive urine culture reviewed by Fonda Ruby PA-C Treated with Nitrofurantoin , organism sensitive to the same and no further patient follow-up is required at this time.  Jama Wyman Kipper 03/07/2024, 9:38 AM

## 2024-09-26 ENCOUNTER — Emergency Department (HOSPITAL_COMMUNITY)
Admission: EM | Admit: 2024-09-26 | Discharge: 2024-09-26 | Disposition: A | Discharge status: Home / Self Care | Payer: Self-pay | Attending: Emergency Medicine | Admitting: Emergency Medicine

## 2024-09-26 DIAGNOSIS — Z7982 Long term (current) use of aspirin: Secondary | ICD-10-CM | POA: Insufficient documentation

## 2024-09-26 DIAGNOSIS — G43009 Migraine without aura, not intractable, without status migrainosus: Secondary | ICD-10-CM | POA: Insufficient documentation

## 2024-09-26 MED ORDER — DEXAMETHASONE SOD PHOSPHATE PF 10 MG/ML IJ SOLN
10.0000 mg | Freq: Once | INTRAMUSCULAR | Status: AC
  Administered 2024-09-26: 10 mg via INTRAVENOUS
  Filled 2024-09-26: qty 1

## 2024-09-26 MED ORDER — SODIUM CHLORIDE 0.9 % IV BOLUS
1000.0000 mL | Freq: Once | INTRAVENOUS | Status: AC
  Administered 2024-09-26: 1000 mL via INTRAVENOUS

## 2024-09-26 MED ORDER — PROCHLORPERAZINE EDISYLATE 10 MG/2ML IJ SOLN
10.0000 mg | Freq: Once | INTRAMUSCULAR | Status: AC
  Administered 2024-09-26: 10 mg via INTRAVENOUS
  Filled 2024-09-26: qty 2

## 2024-09-26 MED ORDER — TRAMADOL HCL 50 MG PO TABS: 50.0000 mg | ORAL_TABLET | Freq: Four times a day (QID) | ORAL | 0 refills | Status: AC | PRN

## 2024-09-26 MED ORDER — DIPHENHYDRAMINE HCL 50 MG/ML IJ SOLN
12.5000 mg | Freq: Once | INTRAMUSCULAR | Status: AC
  Administered 2024-09-26: 12.5 mg via INTRAVENOUS
  Filled 2024-09-26: qty 1

## 2024-09-26 NOTE — ED Triage Notes (Signed)
 Pt c/o migraine for the last three days. Endorses nausea, light sensitivity. Hx of migraines. Pt denies unilateral weakness or vision changes.

## 2024-09-26 NOTE — ED Notes (Signed)
 Pt/family received d/c paperwork at this time. After going over the paperwork any questions, comments, or concerns were answered to the best of this nurse's knowledge. The pt/family verbally acknowledged the teachings/instructions.

## 2024-09-26 NOTE — ED Provider Notes (Signed)
 " [place] EMERGENCY DEPARTMENT AT [facility] Provider Note   CSN: [id number] Arrival date & time: 09/26/24  1315     Patient presents with: Migraine   Alexa Johnson is a 64 y.o. female with a history including migraine headaches, frequent sinusitis presenting with a 3-day history of migraine headache.  She endorses pain across her forehead and behind her eyes and has nausea without emesis and worsening light sensitivity over the past day.  She has had some clear nasal discharge but denies nasal congestion, fevers or chills.  Symptoms are most consistent with a migraine headache.  She has had no focal weakness, dizziness, but has had poor p.o. intake since the onset of her symptoms.  Endorses general weakness.   The history is provided by the patient.       Prior to Admission medications  Medication Sig Start Date End Date Taking? Authorizing Provider  traMADol  (ULTRAM ) 50 MG tablet Take 1 tablet (50 mg total) by mouth every 6 (six) hours as needed. 09/26/24  Yes Miner Koral, PA-C  albuterol (VENTOLIN HFA) 108 (90 Base) MCG/ACT inhaler Inhale 1-2 puffs into the lungs every 6 (six) hours as needed for wheezing or shortness of breath.    [provider]  aspirin-acetaminophen -caffeine (EXCEDRIN MIGRAINE) 250-250-65 MG tablet Take 2 tablets by mouth every 6 (six) hours as needed for headache.    [provider]  calcium  carbonate (TUMS - DOSED IN MG ELEMENTAL CALCIUM ) 500 MG chewable tablet Chew 1,000-1,500 mg by mouth daily as needed for indigestion or heartburn. Patient not taking: Reported on 01/30/2022    [provider]  Carboxymethylcellulose Sodium (THERATEARS OP) Place 1 drop into both eyes daily as needed (dry eyes).    [provider]  cephALEXin  (KEFLEX ) 500 MG capsule Take 1 capsule (500 mg total) by mouth 2 (two) times daily. Patient not taking: Reported on 11/19/2020 08/19/20   Haze Lonni PARAS, MD  dicyclomine  (BENTYL ) 20 MG tablet  Take 1 tablet (20 mg total) by mouth 2 (two) times daily. 10/06/21   Lorelle Aleck BROCKS, PA-C  famotidine (PEPCID) 10 MG tablet Take 20 mg by mouth daily as needed for heartburn or indigestion.    [provider]  fexofenadine (ALLEGRA) 180 MG tablet Take 180 mg by mouth daily.    [provider]  FIBER PO Take 5 capsules by mouth daily.    [provider]  fluticasone  (FLONASE ) 50 MCG/ACT nasal spray Place 2 sprays into both nostrils daily. Patient taking differently: Place 2 sprays into both nostrils daily as needed for allergies. 10/11/16   Triplett, Tammy, PA-C  gabapentin (NEURONTIN) 300 MG capsule Take 300 mg by mouth 2 (two) times daily as needed (pain). 05/06/20   [provider]  ibuprofen  (ADVIL ) 600 MG tablet Take 600 mg by mouth every 8 (eight) hours as needed for moderate pain.    [provider]  Ketotifen Fumarate (ALAWAY OP) Place 1 drop into both eyes daily as needed (allergies).    [provider]  lidocaine  (LIDODERM ) 5 % Place 1 patch onto the skin daily. Remove & Discard patch within 12 hours or as directed by MD 02/05/22   Emelia Sluder, PA-C  Multiple Vitamin (MULTIVITAMIN) tablet Take 1 tablet by mouth daily.    [provider]  naproxen  (NAPROSYN ) 500 MG tablet Take 1 tablet (500 mg total) by mouth 2 (two) times daily. 06/03/21   Geroldine Berg, MD  nitrofurantoin , macrocrystal-monohydrate, (MACROBID ) 100 MG capsule Take 1  capsule (100 mg total) by mouth 2 (two) times daily. 03/03/24   Yolande Lamar BROCKS, MD  Omega-3 Fatty Acids (OMEGA 3 PO) Take 1,200 mg by mouth daily.    [provider]  ondansetron  (ZOFRAN -ODT) 4 MG disintegrating tablet Take 1 tablet (4 mg total) by mouth every 8 (eight) hours as needed for nausea or vomiting. 10/06/21   Aberman, Caroline C, PA-C  oxyCODONE -acetaminophen  (PERCOCET) 5-325 MG tablet Take 1-2 tablets by mouth every 6 (six) hours as needed. 06/03/21   Geroldine Berg, MD   Phenylephrine-Acetaminophen  (TYLENOL  SINUS+HEADACHE PO) Take 2 tablets by mouth daily as needed (sinus headaches).    [provider]  rosuvastatin  (CRESTOR ) 10 MG tablet Take 1 tablet (10 mg total) by mouth daily. 02/03/22   Bacchus, Meade PEDLAR, FNP  triamcinolone  cream (KENALOG ) 0.1 % Apply 1 application. topically 2 (two) times daily. 01/30/22   Bacchus, Meade PEDLAR, FNP  Vitamin D , Ergocalciferol , (DRISDOL ) 1.25 MG (50000 UNIT) CAPS capsule Take 1 capsule (50,000 Units total) by mouth every 7 (seven) days. 02/03/22   Bacchus, Meade PEDLAR, FNP    Allergies: Hydrocodone, Oxycodone , and Sulfa antibiotics    Review of Systems  Constitutional:  Negative for fever.  HENT:  Positive for rhinorrhea. Negative for congestion and sore throat.   Eyes:  Positive for photophobia.  Respiratory:  Negative for chest tightness and shortness of breath.   Cardiovascular:  Negative for chest pain.  Gastrointestinal:  Positive for nausea. Negative for abdominal pain and vomiting.  Genitourinary: Negative.   Musculoskeletal:  Negative for arthralgias, joint swelling and neck pain.  Skin: Negative.  Negative for rash and wound.  Neurological:  Positive for weakness and headaches. Negative for dizziness, light-headedness and numbness.  Psychiatric/Behavioral: Negative.    All other systems reviewed and are negative.   Updated Vital Signs BP (!) 146/93 (BP Location: Right Arm)   Pulse 94   Temp (!) 97.3 F (36.3 C)   Resp 16   SpO2 94%   Physical Exam Vitals and nursing note reviewed.  Constitutional:      Appearance: She is well-developed.     Comments: Uncomfortable appearing  HENT:     Head: Normocephalic and atraumatic.     Right Ear: Tympanic membrane normal.     Left Ear: Tympanic membrane normal.  Eyes:     Extraocular Movements: Extraocular movements intact.     Pupils: Pupils are equal, round, and reactive to light.  Cardiovascular:     Rate and Rhythm: Normal rate.     Heart sounds:  Normal heart sounds.  Pulmonary:     Effort: Pulmonary effort is normal.  Abdominal:     Palpations: Abdomen is soft.     Tenderness: There is no abdominal tenderness.  Musculoskeletal:        General: Normal range of motion.     Cervical back: Normal range of motion and neck supple.  Lymphadenopathy:     Cervical: No cervical adenopathy.  Skin:    General: Skin is warm and dry.     Findings: No rash.  Neurological:     General: No focal deficit present.     Mental Status: She is alert and oriented to person, place, and time.     GCS: GCS eye subscore is 4. GCS verbal subscore is 5. GCS motor subscore is 6.     Cranial Nerves: No cranial nerve deficit.     Sensory: No sensory deficit.     Coordination: Coordination normal.  Gait: Gait normal.     Deep Tendon Reflexes: Reflexes normal.     Comments: Normal heel-shin, normal rapid alternating movements. Cranial nerves III-XII intact.  No pronator drift.  Psychiatric:        Speech: Speech normal.        Behavior: Behavior normal.        Thought Content: Thought content normal.     (all labs ordered are listed, but only abnormal results are displayed) Labs Reviewed - No data to display  EKG: None  Radiology: No results found.   Procedures   Medications Ordered in the ED  sodium chloride  0.9 % bolus 1,000 mL (1,000 mLs Intravenous New Bag/Given 09/26/24 1530)  prochlorperazine  (COMPAZINE ) injection 10 mg (10 mg Intravenous Given 09/26/24 1538)  diphenhydrAMINE  (BENADRYL ) injection 12.5 mg (12.5 mg Intravenous Given 09/26/24 1530)  dexamethasone  (DECADRON ) injection 10 mg (10 mg Intravenous Given 09/26/24 1530)                                    Medical Decision Making Patient presenting with migraine headache along with photophobia, has history of the same, patient stating her current symptoms are similar to prior migraines.  She has no neurologic deficits on today's exam.  She was given IV fluids along with migraine  cocktail and her symptoms have nearly resolved at time of discharge.  She typically uses Excedrin Migraine at home.  I have also prescribed her a few tramadol  tablets if her headache persists, cautions discussed regarding sedation.  Risk Prescription drug management.        Final diagnoses:  Migraine without aura and without status migrainosus, not intractable    ED Discharge Orders          Ordered    traMADol  (ULTRAM ) 50 MG tablet  Every 6 hours PRN        09/26/24 1620               Leonardo Plaia, PA-C 09/26/24 1832    Dean Clarity, MD 09/29/24 1356  "

## 2024-09-26 NOTE — Discharge Instructions (Signed)
 You are prescribed medication in case you need any additional pain relief for your headache.  Rest this evening.  Make sure you are drinking plenty of fluids.
